# Patient Record
Sex: Male | Born: 1990 | Hispanic: Yes | Marital: Single | Smoking: Current every day smoker
Health system: Southern US, Community
[De-identification: ages and names within clinical notes are randomized; demographics above are authoritative.]

## PROBLEM LIST (undated history)

## (undated) DIAGNOSIS — F909 Attention-deficit hyperactivity disorder, unspecified type: Secondary | ICD-10-CM

## (undated) DIAGNOSIS — K219 Gastro-esophageal reflux disease without esophagitis: Secondary | ICD-10-CM

## (undated) DIAGNOSIS — F319 Bipolar disorder, unspecified: Secondary | ICD-10-CM

## (undated) DIAGNOSIS — F603 Borderline personality disorder: Secondary | ICD-10-CM

---

## 2021-01-26 ENCOUNTER — Emergency Department (HOSPITAL_COMMUNITY)
Admission: EM | Admit: 2021-01-26 | Discharge: 2021-01-27 | Disposition: A | Discharge status: Home / Self Care | Payer: Self-pay | Attending: Emergency Medicine | Admitting: Emergency Medicine

## 2021-01-26 ENCOUNTER — Encounter (HOSPITAL_COMMUNITY): Payer: Self-pay

## 2021-01-26 ENCOUNTER — Other Ambulatory Visit: Payer: Self-pay

## 2021-01-26 DIAGNOSIS — R000 Tachycardia, unspecified: Secondary | ICD-10-CM | POA: Insufficient documentation

## 2021-01-26 DIAGNOSIS — R45850 Homicidal ideations: Secondary | ICD-10-CM | POA: Insufficient documentation

## 2021-01-26 DIAGNOSIS — Z20822 Contact with and (suspected) exposure to covid-19: Secondary | ICD-10-CM | POA: Insufficient documentation

## 2021-01-26 DIAGNOSIS — F1910 Other psychoactive substance abuse, uncomplicated: Secondary | ICD-10-CM

## 2021-01-26 DIAGNOSIS — F1994 Other psychoactive substance use, unspecified with psychoactive substance-induced mood disorder: Secondary | ICD-10-CM

## 2021-01-26 DIAGNOSIS — F1914 Other psychoactive substance abuse with psychoactive substance-induced mood disorder: Secondary | ICD-10-CM | POA: Insufficient documentation

## 2021-01-26 LAB — RAPID URINE DRUG SCREEN, HOSP PERFORMED
Amphetamines: POSITIVE — AB
Barbiturates: NOT DETECTED
Benzodiazepines: NOT DETECTED
Cocaine: POSITIVE — AB
Opiates: NOT DETECTED
Tetrahydrocannabinol: POSITIVE — AB

## 2021-01-26 LAB — CBC
HCT: 44.4 % (ref 39.0–52.0)
Hemoglobin: 14.7 g/dL (ref 13.0–17.0)
MCH: 28.6 pg (ref 26.0–34.0)
MCHC: 33.1 g/dL (ref 30.0–36.0)
MCV: 86.4 fL (ref 80.0–100.0)
Platelets: 332 10*3/uL (ref 150–400)
RBC: 5.14 MIL/uL (ref 4.22–5.81)
RDW: 13.3 % (ref 11.5–15.5)
WBC: 10.8 10*3/uL — ABNORMAL HIGH (ref 4.0–10.5)
nRBC: 0 % (ref 0.0–0.2)

## 2021-01-26 LAB — RESP PANEL BY RT-PCR (FLU A&B, COVID) ARPGX2
Influenza A by PCR: NEGATIVE
Influenza B by PCR: NEGATIVE
SARS Coronavirus 2 by RT PCR: NEGATIVE

## 2021-01-26 LAB — COMPREHENSIVE METABOLIC PANEL
ALT: 89 U/L — ABNORMAL HIGH (ref 0–44)
AST: 162 U/L — ABNORMAL HIGH (ref 15–41)
Albumin: 4.7 g/dL (ref 3.5–5.0)
Alkaline Phosphatase: 77 U/L (ref 38–126)
Anion gap: 12 (ref 5–15)
BUN: 17 mg/dL (ref 6–20)
CO2: 25 mmol/L (ref 22–32)
Calcium: 9.9 mg/dL (ref 8.9–10.3)
Chloride: 100 mmol/L (ref 98–111)
Creatinine, Ser: 1.54 mg/dL — ABNORMAL HIGH (ref 0.61–1.24)
GFR, Estimated: >60 mL/min (ref >60)
Glucose, Bld: 109 mg/dL — ABNORMAL HIGH (ref 70–99)
Potassium: 3.6 mmol/L (ref 3.5–5.1)
Sodium: 137 mmol/L (ref 135–145)
Total Bilirubin: 1.8 mg/dL — ABNORMAL HIGH (ref 0.3–1.2)
Total Protein: 7.6 g/dL (ref 6.5–8.1)

## 2021-01-26 LAB — ETHANOL: Alcohol, Ethyl (B): <10 mg/dL (ref <10)

## 2021-01-26 NOTE — ED Notes (Signed)
Pt made phone call to brother at nursing station

## 2021-01-26 NOTE — ED Triage Notes (Signed)
Emergency Medicine Provider Triage Evaluation Note  Jonathan Montgomery , a 30 y.o. male  was evaluated in triage.  Pt brought in by [employer] under IVC order, per IVC patient was going to kill himself and girlfriend.  Patient recently moved here from [place], admits to heroin use, denies medical complaints.  Review of Systems  Positive: SI., HI Negative: CP, abdominal pain, injury  Physical Exam  BP (!) 137/98 (BP Location: Right Arm)   Pulse (!) 119   Temp 98.7 F (37.1 C)   Resp 18   SpO2 100%  Gen:   Awake, no distress   HEENT:  Injury to nose, denies acute injury Resp:  Normal effort  Cardiac:  Tachycardic Abd:   Nondistended, nontender  MSK:   Moves extremities without difficulty  Neuro:  Speech clear, pressured  Medical Decision Making  Medically screening exam initiated at 10:16 AM.  Appropriate orders placed.  Jonathan Montgomery was informed that the remainder of the evaluation will be completed by another provider, this initial triage assessment does not replace that evaluation, and the importance of remaining in the ED until their evaluation is complete.  Clinical Impression  Substance abuse, psychosis,   Jeannie Fend, PA-C 01/26/21 1028

## 2021-01-26 NOTE — BH Assessment (Signed)
TTS Assessment. Awaiting provider disposition.

## 2021-01-26 NOTE — ED Notes (Addendum)
Per Melynda Ripple  DISPOSITION: Gave clinical report to [facility], NP, who determined patient meets criteria for overnight observation. Pending am psych evaluation and collateral information from additional supports. Clinician was able to obtain very limited to no information about patient's safety, history, etc.. Further discussed disposition with night time provider Otila Back, NP) who recommended overnight observation and am psych evaluation.
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
## 2021-01-26 NOTE — ED Provider Notes (Signed)
[facility] EMERGENCY DEPARTMENT Provider Note   CSN: [id number] Arrival date & time: 01/26/21  8466     History Chief Complaint  Patient presents with  . IVC    Jonathan Montgomery is a 30 y.o. male.  HPI   Patient is a 30 year old male who presents the emergency department in [employer] custody under IVC.  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  He had been clean for over a year and relapsed over the last few days with his girlfriend.  His girlfriend is also using drugs.  He states that that an argument and according to the IVC paperwork his girlfriend told the police that he stated that he was going to kill himself as well as her.  Patient denies any physical symptoms at this time.  He is a small amount of dried blood noted to the bridge of his nose which he states is from "showering too much a few days ago".  Patient denies any physical complaints.  Police state that he was nauseated and retching when they arrived but this quickly resolved and he has had no further occurrences.  Denies any nausea or vomiting to me. Denies any recent or regular ETOH use.      History reviewed. No pertinent past medical history.  There are no problems to display for this patient.   History reviewed. No pertinent surgical history.     History reviewed. No pertinent family history.  Social History   Tobacco Use  . Smoking status: Never Smoker  . Smokeless tobacco: Never Used    Home Medications Prior to Admission medications   Not on File    Allergies    Patient has no allergy information on record.  Review of Systems   Review of Systems  All other systems reviewed and are negative. Ten systems reviewed and are negative for acute change, except as noted in the HPI.    Physical Exam Updated Vital Signs BP (!) 153/84 (BP Location: Left Arm)   Pulse (!) 105   Temp 98.7 F (37.1 C)   Resp 18   SpO2  98%   Physical Exam Vitals and nursing note reviewed.  Constitutional:      General: He is not in acute distress.    Appearance: Normal appearance. He is not ill-appearing, toxic-appearing or diaphoretic.  HENT:     Head: Normocephalic and atraumatic.     Right Ear: External ear normal.     Left Ear: External ear normal.     Nose: Nose normal.     Mouth/Throat:     Mouth: Mucous membranes are moist.     Pharynx: Oropharynx is clear. No oropharyngeal exudate or posterior oropharyngeal erythema.  Eyes:     Extraocular Movements: Extraocular movements intact.  Cardiovascular:     Rate and Rhythm: Regular rhythm. Tachycardia present.     Pulses: Normal pulses.     Heart sounds: Normal heart sounds. No murmur heard. No friction rub. No gallop.   Pulmonary:     Effort: Pulmonary effort is normal. No respiratory distress.     Breath sounds: Normal breath sounds. No stridor. No wheezing, rhonchi or rales.  Abdominal:     General: Abdomen is flat.     Tenderness: There is no abdominal tenderness.  Musculoskeletal:        General: Normal range of motion.     Cervical back: Normal range of motion and neck  supple. No tenderness.  Skin:    General: Skin is warm and dry.  Neurological:     General: No focal deficit present.     Mental Status: He is alert and oriented to person, place, and time.  Psychiatric:        Attention and Perception: Attention normal. He is attentive.        Mood and Affect: Mood normal.        Speech: Speech is rapid and pressured.        Behavior: Behavior is hyperactive. Behavior is cooperative.        Thought Content: Thought content includes homicidal ideation.    ED Results / Procedures / Treatments   Labs (all labs ordered are listed, but only abnormal results are displayed) Labs Reviewed  COMPREHENSIVE METABOLIC PANEL - Abnormal; Notable for the following components:      Result Value   Glucose, Bld 109 (*)    Creatinine, Ser 1.54 (*)    AST 162  (*)    ALT 89 (*)    Total Bilirubin 1.8 (*)    All other components within normal limits  CBC - Abnormal; Notable for the following components:   WBC 10.8 (*)    All other components within normal limits  RESP PANEL BY RT-PCR (FLU A&B, COVID) [id number]  ETHANOL  RAPID URINE DRUG SCREEN, HOSP PERFORMED   EKG None  Radiology No results found.  Procedures Procedures   Medications Ordered in ED Medications - No data to display  ED Course  I have reviewed the triage vital signs and the nursing notes.  Pertinent labs & imaging results that were available during my care of the patient were reviewed by me and considered in my medical decision making (see chart for details).    MDM Rules/Calculators/A&P                          Patient is a 30 year old male with history of polysubstance abuse who presents the emergency department under IVC via [employer].  Per IVC paperwork, patient was threatening to kill himself as well as his girlfriend.  On my exam he speaks erratically and appears tangential.  He has difficulty staying on topic.  He has been using IV heroin as well as IV methamphetamines.  He last used last night.  Denies any recent or regular alcohol use.  Basic labs obtained.  Patient appears medically cleared at this time.  TTS consult has been placed.  Disposition pending TTS recommendations.  Final Clinical Impression(s) / ED Diagnoses Final diagnoses:  Homicidal behavior  Polysubstance abuse [facility])    Rx / DC Orders ED Discharge Orders    None       Placido Sou, PA-C 01/26/21 1350    Cathren Laine, MD 01/26/21 1525

## 2021-01-26 NOTE — ED Triage Notes (Signed)
Pt BIB [employer] due to IVC. Per IVC paperwork pt reports he was going to kill himself and girlfriend. Pt recently moved here from [place]. Pt Is on heroin.

## 2021-01-26 NOTE — BH Assessment (Signed)
DISPOSITION: Gave clinical report to [facility] Rankin, NP,  who determined patient meets criteria for overnight observation. Pending am psych evaluation and collateral information from additional supports. Clinician was able to obtain very limited to no information about patient's safety, history, etc from patient's brother [name]) [phone] . Further discussed disposition with night time provider Otila Back, NP) who recommended overnight observation and am psych evaluation. [name], RN), provided updates regarding patient's plan of care

## 2021-01-26 NOTE — BH Assessment (Addendum)
TTS Clinician requested [place], NP to place TTS machine in patient's room.

## 2021-01-26 NOTE — ED Notes (Signed)
Pt belongings inventoried and valuables given to security. Pt wanded by security.

## 2021-01-27 DIAGNOSIS — F1994 Other psychoactive substance use, unspecified with psychoactive substance-induced mood disorder: Secondary | ICD-10-CM

## 2021-01-27 MED ORDER — LORAZEPAM 2 MG/ML IJ SOLN
2.0000 mg | Freq: Once | INTRAMUSCULAR | Status: AC
  Administered 2021-01-27: 2 mg via INTRAMUSCULAR
  Filled 2021-01-27: qty 1

## 2021-01-27 MED ORDER — HALOPERIDOL LACTATE 5 MG/ML IJ SOLN
5.0000 mg | Freq: Once | INTRAMUSCULAR | Status: AC
  Administered 2021-01-27: 5 mg via INTRAMUSCULAR
  Filled 2021-01-27: qty 1

## 2021-01-27 NOTE — ED Notes (Signed)
Pt discharged home per MD order. Discharge summary reviewed with pt, pt verbalizes understanding. Reports discharge ride home. Personal property returned to pt. Pt denies SI/HI/AVH. Ambulatory off unit. No s/s of acute distress noted.

## 2021-01-27 NOTE — ED Notes (Addendum)
This RN went to get pt ready for transfer. Pt asked "am I waiting for transport? To where?" This RN notified pt that he was going to [facility]. At this point pt began yelling "why am I going to [facility]? I have cooperated. I have listened." Etc. This RN let pt know that he was seen by the counselor and that is what they saw best. Pt states "I want to speak to someone. I am not going!" At this time pt jumped out of bed and ran down hallway to try and leave the department. Pt was stopped and detained by [facility] and Tax adviser. Dr. Julieanne Manson notified and med orders are to be put in. Pt unable to go to [facility] now since we had to medicate him. Security at bedside now. This RN called [employer] to cancel transport.

## 2021-01-27 NOTE — ED Notes (Signed)
[employer] non emergency line called to transfer this pt to [facility]

## 2021-01-27 NOTE — Discharge Instructions (Addendum)
You have been cleared by behavioral health for discharge home.  Follow-up as per behavioral health. 

## 2021-01-27 NOTE — ED Provider Notes (Signed)
I was informed by nursing the patient is been accepted to behavioral urgent care for evaluation this morning.  I informed the patient of this and he initially agreed.  EMTALA documentation was filled out for patient to be transferred.  He is under IVC currently.  As I was seeing other patient, I was told that patient tried to escape and was very agitated and had to be taken back to his exam bed.  As the patient is now agitated and is requiring medication to calm him down, I suspect he is not appropriate for behavioral urgent care at this time.  We will have nursing communicate this with the TTS team and have them reassess patient to determine best place for patient to be further evaluated today.   Gayle Collard, Canary Brim, MD 01/27/21 [phone]

## 2021-01-27 NOTE — ED Notes (Signed)
EDP Zackowski to resend IVC ; Diplomatic Services operational officer to fax.

## 2021-01-27 NOTE — Consult Note (Signed)
Telepsych Consultation   Reason for Consult:  Psychiatry provider assessment Referring Physician:  [facility] Location of [facility]: [facility] Location of Provider: Behavioral Health TTS Department  Patient Identification: Jonathan Montgomery MRN:  [id number] Principal Diagnosis: Substance induced mood disorder (HCC) Diagnosis:  Principal Problem:   Substance induced mood disorder (HCC)   Total Time spent with patient: 30 minutes  Subjective:   Jonathan Montgomery is a 30 y.o. male patient admitted with involuntary commitment petition.  Patient states "me and my girlfriend are on drugs, I came here to get off of them."  Patient reports daily use of marijuana, methamphetamine and heroin.  He reports this has gone on for approximately 6 months. Jonathan Montgomery states "my girlfriend went out to buy more dope and then came home, 2 hours later the cops were at the door and brought him here."  HPI:   Patient reassessed by nurse practitioner.  He is alert and oriented, appears anxious when discussing discharge.  He is cooperative and answers appropriately during assessment.  Jonathan Montgomery denies suicidal and homicidal ideations.  He also denies any history of suicide attempts.  Jonathan Montgomery reports history of cutting, last cutting episode "years ago."  Midwife verbally for safety with this Clinical research associate.  He denies auditory and visual hallucinations, does make reference to hallucinations while using methamphetamine.  Patient denies any command hallucinations.  He denies symptoms of paranoia.  Jonathan Montgomery denies any mental health history.  He denies any current medications.  He reports he has been treated for substance use in the past and that AA worked well for him.  He denies currently having a sponsor but is willing to follow-up with AA on an outpatient basis.  Jonathan Montgomery resides in [place] with his girlfriend.  He reports he relocated to [place] from [place] 2 months ago related to this girlfriend.   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  He endorses average sleep and appetite.  He denies alcohol use.  Jonathan Montgomery noted to have abrasion to nose.  Reports this abrasion is related to "showering too long."  He denies pain.  He denies being involved in physical altercation.  Jonathan Montgomery reports he would like to discharge to girlfriend's home then work out arrangements to purchase a flight to his brother's home in [place].  Jonathan Montgomery refuses consent to speak with his girlfriend, states "she is on meth and heroin as well, she is using as much as I am, I do not think you should talk to another drug addict."  Jonathan Montgomery does give consent to speak with his brother, Jonathan Montgomery phone number [phone].  Attempted to call brother x2, voicemail not set up.  TTS counselor spoke with brother on yesterday who denies concern for patient safety.  Past Psychiatric History: none reported  Risk to Self:   Denies Risk to Others:   Denies Prior Inpatient Therapy:   None reported Prior Outpatient Therapy:   None reported  Past Medical History: History reviewed. No pertinent past medical history. History reviewed. No pertinent surgical history. Family History: History reviewed. No pertinent family history. Family Psychiatric  History: None reported Social History:  Social History   Substance and Sexual Activity  Alcohol Use None     Social History   Substance and Sexual Activity  Drug Use Not on file    Social History   Socioeconomic History  . Marital status: Single    Spouse name: Not on file  . Number of children: Not on  file  . Years of education: Not on file  . Highest education level: Not on file  Occupational History  . Not on file  Tobacco Use  . Smoking status: Never Smoker  . Smokeless tobacco: Never Used  Substance and Sexual Activity  . Alcohol use: Not on file  . Drug use: Not on file  . Sexual activity: Not on file  Other Topics Concern  . Not on file  Social  History Narrative  . Not on file   Social Determinants of Health   Financial Resource Strain: Not on file  Food Insecurity: Not on file  Transportation Needs: Not on file  Physical Activity: Not on file  Stress: Not on file  Social Connections: Not on file   Additional Social History:    Allergies:  Not on File  Labs:  Results for orders placed or performed during the hospital encounter of 01/26/21 (from the past 48 hour(s))  Comprehensive metabolic panel     Status: Abnormal   Collection Time: 01/26/21 10:07 AM  Result Value Ref Range   Sodium 137 135 - 145 mmol/L   Potassium 3.6 3.5 - 5.1 mmol/L   Chloride 100 98 - 111 mmol/L   CO2 25 22 - 32 mmol/L   Glucose, Bld 109 (H) 70 - 99 mg/dL    Comment: Glucose reference range applies only to samples taken after fasting for at least 8 hours.   BUN 17 6 - 20 mg/dL   Creatinine, Ser 0.25 (H) 0.61 - 1.24 mg/dL   Calcium 9.9 8.9 - 42.7 mg/dL   Total Protein 7.6 6.5 - 8.1 g/dL   Albumin 4.7 3.5 - 5.0 g/dL   AST 062 (H) 15 - 41 U/L   ALT 89 (H) 0 - 44 U/L   Alkaline Phosphatase 77 38 - 126 U/L   Total Bilirubin 1.8 (H) 0.3 - 1.2 mg/dL   GFR, Estimated >37 >62 mL/min    Comment: (NOTE) Calculated using the CKD-EPI Creatinine Equation (2021)    Anion gap 12 5 - 15    Comment: Performed at [facility], [address] [place], [place] [address]  Ethanol     Status: None   Collection Time: 01/26/21 10:07 AM  Result Value Ref Range   Alcohol, Ethyl (B) <10 <10 mg/dL    Comment: (NOTE) Lowest detectable limit for serum alcohol is 10 mg/dL.  For medical purposes only. Performed at [facility], [address] [place], [place] [address]   cbc     Status: Abnormal   Collection Time: 01/26/21 10:07 AM  Result Value Ref Range   WBC 10.8 (H) 4.0 - 10.5 K/uL   RBC 5.14 4.22 - 5.81 MIL/uL   Hemoglobin 14.7 13.0 - 17.0 g/dL   HCT 73.7 10.6 - 26.9 %   MCV 86.4 80.0 - 100.0 fL   MCH 28.6 26.0 - 34.0 pg   MCHC 33.1  30.0 - 36.0 g/dL   RDW 48.5 46.2 - 70.3 %   Platelets 332 150 - 400 K/uL   nRBC 0.0 0.0 - 0.2 %    Comment: Performed at [facility], [address], [place], [place] [address]  Resp Panel by RT-PCR (Flu A&B, Covid) Nasopharyngeal Swab     Status: None   Collection Time: 01/26/21 12:38 PM   Specimen: Nasopharyngeal Swab; Nasopharyngeal(NP) swabs in vial transport medium  Result Value Ref Range   SARS Coronavirus 2 by RT PCR NEGATIVE NEGATIVE  Comment: (NOTE) SARS-CoV-2 target nucleic acids are NOT DETECTED.  The SARS-CoV-2 RNA is generally detectable in upper respiratory specimens during the acute phase of infection. The lowest concentration of SARS-CoV-2 viral copies this assay can detect is 138 copies/mL. A negative result does not preclude SARS-Cov-2 infection and should not be used as the sole basis for treatment or other patient management decisions. A negative result may occur with  improper specimen collection/handling, submission of specimen other than nasopharyngeal swab, presence of viral mutation(s) within the areas targeted by this assay, and inadequate number of viral copies(<138 copies/mL). A negative result must be combined with clinical observations, patient history, and epidemiological information. The expected result is Negative.  Fact Sheet for Patients:  BloggerCourse.com  Fact Sheet for Healthcare Providers:  SeriousBroker.it  This test is no t yet approved or cleared by the [place] FDA and  has been authorized for detection and/or diagnosis of SARS-CoV-2 by FDA under an Emergency Use Authorization (EUA). This EUA will remain  in effect (meaning this test can be used) for the duration of the COVID-19 declaration under Section 564(b)(1) of the Act, 21 U.S.C.section 360bbb-3(b)(1), unless the authorization is terminated  or revoked sooner.       Influenza A by PCR NEGATIVE NEGATIVE    Influenza B by PCR NEGATIVE NEGATIVE    Comment: (NOTE) The Xpert Xpress SARS-CoV-2/FLU/RSV plus assay is intended as an aid in the diagnosis of influenza from Nasopharyngeal swab specimens and should not be used as a sole basis for treatment. Nasal washings and aspirates are unacceptable for Xpert Xpress SARS-CoV-2/FLU/RSV testing.  Fact Sheet for Patients: BloggerCourse.com  Fact Sheet for Healthcare Providers: SeriousBroker.it  This test is not yet approved or cleared by the [place] FDA and has been authorized for detection and/or diagnosis of SARS-CoV-2 by FDA under an Emergency Use Authorization (EUA). This EUA will remain in effect (meaning this test can be used) for the duration of the COVID-19 declaration under Section 564(b)(1) of the Act, 21 U.S.C. section 360bbb-3(b)(1), unless the authorization is terminated or revoked.  Performed at [facility], [address] [place], [place] [address]   Rapid urine drug screen (hospital performed)     Status: Abnormal   Collection Time: 01/26/21  5:55 PM  Result Value Ref Range   Opiates NONE DETECTED NONE DETECTED   Cocaine POSITIVE (A) NONE DETECTED   Benzodiazepines NONE DETECTED NONE DETECTED   Amphetamines POSITIVE (A) NONE DETECTED   Tetrahydrocannabinol POSITIVE (A) NONE DETECTED   Barbiturates NONE DETECTED NONE DETECTED    Comment: (NOTE) DRUG SCREEN FOR MEDICAL PURPOSES ONLY.  IF CONFIRMATION IS NEEDED FOR ANY PURPOSE, NOTIFY LAB WITHIN 5 DAYS.  LOWEST DETECTABLE LIMITS FOR URINE DRUG SCREEN Drug Class                     Cutoff (ng/mL) Amphetamine and metabolites    1000 Barbiturate and metabolites    200 Benzodiazepine                 200 Tricyclics and metabolites     300 Opiates and metabolites        300 Cocaine and metabolites        300 THC                            50 Performed at [facility], [address], [place],  [place] [address]  Medications:  No current facility-administered medications for this encounter.   No current outpatient medications on file.    Musculoskeletal: Strength & Muscle Tone: within normal limits Gait & Station: normal Patient leans: N/A  Psychiatric Specialty Exam: Physical Exam Vitals and nursing note reviewed.  Constitutional:      Appearance: He is well-developed.  HENT:     Head: Normocephalic.  Cardiovascular:     Rate and Rhythm: Normal rate.  Pulmonary:     Effort: Pulmonary effort is normal.  Musculoskeletal:        General: Normal range of motion.     Cervical back: Normal range of motion.  Skin:    Findings: Abrasion present.       Neurological:     Mental Status: He is alert and oriented to person, place, and time.  Psychiatric:        Attention and Perception: Attention and perception normal.        Mood and Affect: Mood is anxious. Affect is labile.        Speech: Speech normal.        Behavior: Behavior is cooperative.        Thought Content: Thought content normal.        Cognition and Memory: Cognition and memory normal.        Judgment: Judgment normal.     Review of Systems  Constitutional: Negative.   HENT: Negative.   Eyes: Negative.   Respiratory: Negative.   Cardiovascular: Negative.   Gastrointestinal: Negative.   Genitourinary: Negative.   Musculoskeletal: Negative.   Skin: Negative.   Neurological: Negative.   Psychiatric/Behavioral: The patient is nervous/anxious.     Blood pressure (!) 138/99, pulse 92, temperature 98.2 F (36.8 C), temperature source Oral, resp. rate 20, SpO2 99 %.There is no height or weight on file to calculate BMI.  General Appearance: Casual  Eye Contact:  Good  Speech:  Clear and Coherent and Normal Rate  Volume:  Normal  Mood:  Anxious  Affect:  Appropriate and Congruent  Thought Process:  Coherent, Goal Directed and Descriptions of Associations: Intact  Orientation:  Full (Time, Place, and  Person)  Thought Content:  Logical  Suicidal Thoughts:  No  Homicidal Thoughts:  No  Memory:  Immediate;   Good Recent;   Good Remote;   Good  Judgement:  Fair  Insight:  Fair  Psychomotor Activity:  Normal  Concentration:  Concentration: Good and Attention Span: Good  Recall:  Good  Fund of Knowledge:  Good  Language:  Good  Akathisia:  No  Handed:  Right  AIMS (if indicated):     Assets:  Communication Skills Desire for Improvement Financial Resources/Insurance Housing Intimacy Leisure Time Physical Health Resilience Social Support  ADL's:  Intact  Cognition:  WNL  Sleep:        Treatment Plan Summary: Plan Patient reviewed with Dr. Bronwen Betters. TOC ordered to assist with substance use treatment resources. Peer  Support consult initiated. Recommend follow-up with outpatient psychiatry, resources provided.  Disposition: No evidence of imminent risk to self or others at present.   Patient does not meet criteria for psychiatric inpatient admission. Supportive therapy provided about ongoing stressors. Discussed crisis plan, support from social network, calling 911, coming to the Emergency Department, and calling Suicide Hotline.  This service was provided via telemedicine using a 2-way, interactive audio and video technology.  Names of all persons participating in this telemedicine service and their role in this encounter. Name: Kelden Lavallee  Role: Patient  Name: Doran Heaterina Michaeljoseph Revolorio Role: FNP  Name: Dr. Bronwen BettersLaubach Role: Psychiatry    Jonathan Lanceina L Tyshon Fanning, FNP 01/27/2021 8:25 AM

## 2021-01-27 NOTE — ED Notes (Signed)
Per Marzetta Board RN pt can be transferred to [facility] - accepted by Dr. Lucianne Muss. EMTALA to be completed

## 2021-01-27 NOTE — ED Notes (Signed)
Security at bedside. Pt increasing upset. Pt refusing meds saying "I have a right to refuse. It is against my religion." This RN, Sophie RN, Education officer, community, and security notified pt that he is under IVC. Pt eventually let Sophie RN administer meds. Security remains at bedside.

## 2021-01-27 NOTE — ED Notes (Signed)
This RN spoke to Lane at [facility] to see if pt will still be accepted since he got medications. Feliz Beam told this RN that he would have to call back. Feliz Beam given number to call back at.

## 2021-01-27 NOTE — ED Notes (Signed)
Tele psych machine to bedside  

## 2021-01-27 NOTE — ED Notes (Signed)
Pt up at nurses station using phone for discharge ride home

## 2021-01-27 NOTE — Progress Notes (Signed)
CSW spoke with patient about providing him with outpatient mental health resource list. Patient told CSW can we go over it later when he is more awake. CSW stated she would leave the resource on his table for him to review. Patient rolled back over.

## 2021-02-05 ENCOUNTER — Emergency Department (HOSPITAL_COMMUNITY)
Admission: EM | Admit: 2021-02-05 | Discharge: 2021-02-06 | Disposition: A | Discharge status: Home / Self Care | Payer: Self-pay | Attending: Emergency Medicine | Admitting: Emergency Medicine

## 2021-02-05 ENCOUNTER — Emergency Department (HOSPITAL_COMMUNITY): Payer: Self-pay

## 2021-02-05 ENCOUNTER — Emergency Department (HOSPITAL_COMMUNITY): Payer: Self-pay | Admitting: Certified Registered Nurse Anesthetist

## 2021-02-05 ENCOUNTER — Encounter (HOSPITAL_COMMUNITY)
Admission: EM | Disposition: A | Discharge status: Home / Self Care | Payer: Self-pay | Source: Home / Self Care | Attending: Emergency Medicine

## 2021-02-05 DIAGNOSIS — S01111A Laceration without foreign body of right eyelid and periocular area, initial encounter: Secondary | ICD-10-CM | POA: Insufficient documentation

## 2021-02-05 DIAGNOSIS — Y9339 Activity, other involving climbing, rappelling and jumping off: Secondary | ICD-10-CM | POA: Insufficient documentation

## 2021-02-05 DIAGNOSIS — F1994 Other psychoactive substance use, unspecified with psychoactive substance-induced mood disorder: Secondary | ICD-10-CM

## 2021-02-05 DIAGNOSIS — T1490XA Injury, unspecified, initial encounter: Secondary | ICD-10-CM

## 2021-02-05 DIAGNOSIS — R52 Pain, unspecified: Secondary | ICD-10-CM

## 2021-02-05 DIAGNOSIS — Z23 Encounter for immunization: Secondary | ICD-10-CM | POA: Insufficient documentation

## 2021-02-05 DIAGNOSIS — M25572 Pain in left ankle and joints of left foot: Secondary | ICD-10-CM | POA: Insufficient documentation

## 2021-02-05 DIAGNOSIS — W1789XA Other fall from one level to another, initial encounter: Secondary | ICD-10-CM | POA: Insufficient documentation

## 2021-02-05 DIAGNOSIS — S52501A Unspecified fracture of the lower end of right radius, initial encounter for closed fracture: Secondary | ICD-10-CM | POA: Insufficient documentation

## 2021-02-05 DIAGNOSIS — Z20822 Contact with and (suspected) exposure to covid-19: Secondary | ICD-10-CM | POA: Insufficient documentation

## 2021-02-05 HISTORY — PX: OPEN REDUCTION INTERNAL FIXATION (ORIF) DISTAL RADIAL FRACTURE: SHX5989

## 2021-02-05 LAB — RESP PANEL BY RT-PCR (FLU A&B, COVID) ARPGX2
Influenza A by PCR: NEGATIVE
Influenza B by PCR: NEGATIVE
SARS Coronavirus 2 by RT PCR: NEGATIVE

## 2021-02-05 LAB — CBC
HCT: 39.9 % (ref 39.0–52.0)
Hemoglobin: 13.3 g/dL (ref 13.0–17.0)
MCH: 27.9 pg (ref 26.0–34.0)
MCHC: 33.3 g/dL (ref 30.0–36.0)
MCV: 83.6 fL (ref 80.0–100.0)
Platelets: 281 10*3/uL (ref 150–400)
RBC: 4.77 MIL/uL (ref 4.22–5.81)
RDW: 12.9 % (ref 11.5–15.5)
WBC: 7.3 10*3/uL (ref 4.0–10.5)
nRBC: 0 % (ref 0.0–0.2)

## 2021-02-05 LAB — COMPREHENSIVE METABOLIC PANEL
ALT: 100 U/L — ABNORMAL HIGH (ref 0–44)
AST: 140 U/L — ABNORMAL HIGH (ref 15–41)
Albumin: 3.6 g/dL (ref 3.5–5.0)
Alkaline Phosphatase: 57 U/L (ref 38–126)
Anion gap: 12 (ref 5–15)
BUN: 10 mg/dL (ref 6–20)
CO2: 25 mmol/L (ref 22–32)
Calcium: 8.8 mg/dL — ABNORMAL LOW (ref 8.9–10.3)
Chloride: 97 mmol/L — ABNORMAL LOW (ref 98–111)
Creatinine, Ser: 1.24 mg/dL (ref 0.61–1.24)
GFR, Estimated: >60 mL/min (ref >60)
Glucose, Bld: 103 mg/dL — ABNORMAL HIGH (ref 70–99)
Potassium: 2.7 mmol/L — CL (ref 3.5–5.1)
Sodium: 134 mmol/L — ABNORMAL LOW (ref 135–145)
Total Bilirubin: 1.4 mg/dL — ABNORMAL HIGH (ref 0.3–1.2)
Total Protein: 6.3 g/dL — ABNORMAL LOW (ref 6.5–8.1)

## 2021-02-05 LAB — I-STAT CHEM 8, ED
BUN: 12 mg/dL (ref 6–20)
Calcium, Ion: 1.09 mmol/L — ABNORMAL LOW (ref 1.15–1.40)
Chloride: 95 mmol/L — ABNORMAL LOW (ref 98–111)
Creatinine, Ser: 1.1 mg/dL (ref 0.61–1.24)
Glucose, Bld: 103 mg/dL — ABNORMAL HIGH (ref 70–99)
HCT: 39 % (ref 39.0–52.0)
Hemoglobin: 13.3 g/dL (ref 13.0–17.0)
Potassium: 3 mmol/L — ABNORMAL LOW (ref 3.5–5.1)
Sodium: 134 mmol/L — ABNORMAL LOW (ref 135–145)
TCO2: 27 mmol/L (ref 22–32)

## 2021-02-05 LAB — LACTIC ACID, PLASMA: Lactic Acid, Venous: 3 mmol/L (ref 0.5–1.9)

## 2021-02-05 LAB — ETHANOL: Alcohol, Ethyl (B): <10 mg/dL (ref <10)

## 2021-02-05 LAB — PROTIME-INR
INR: 1 (ref 0.8–1.2)
Prothrombin Time: 13.5 seconds (ref 11.4–15.2)

## 2021-02-05 SURGERY — OPEN REDUCTION INTERNAL FIXATION (ORIF) DISTAL RADIUS FRACTURE
Anesthesia: Monitor Anesthesia Care | Laterality: Right

## 2021-02-05 MED ORDER — CEFAZOLIN SODIUM-DEXTROSE 2-4 GM/100ML-% IV SOLN
2.0000 g | Freq: Once | INTRAVENOUS | Status: AC
  Administered 2021-02-05: 2 g via INTRAVENOUS
  Filled 2021-02-05: qty 100

## 2021-02-05 MED ORDER — MIDAZOLAM HCL 2 MG/2ML IJ SOLN
INTRAMUSCULAR | Status: DC | PRN
  Administered 2021-02-05: 2 mg via INTRAVENOUS

## 2021-02-05 MED ORDER — LIDOCAINE HCL (PF) 1 % IJ SOLN
30.0000 mL | Freq: Once | INTRAMUSCULAR | Status: AC
  Administered 2021-02-05: 30 mL via INTRADERMAL
  Filled 2021-02-05: qty 30

## 2021-02-05 MED ORDER — IOHEXOL 300 MG/ML  SOLN
100.0000 mL | Freq: Once | INTRAMUSCULAR | Status: AC | PRN
  Administered 2021-02-05: 100 mL via INTRAVENOUS

## 2021-02-05 MED ORDER — LIDOCAINE 2% (20 MG/ML) 5 ML SYRINGE
INTRAMUSCULAR | Status: DC | PRN
  Administered 2021-02-05: 60 mg via INTRAVENOUS

## 2021-02-05 MED ORDER — LORAZEPAM 2 MG/ML IJ SOLN
1.0000 mg | Freq: Once | INTRAMUSCULAR | Status: AC
  Administered 2021-02-05: 1 mg via INTRAVENOUS
  Filled 2021-02-05: qty 1

## 2021-02-05 MED ORDER — PROPOFOL 10 MG/ML IV BOLUS
INTRAVENOUS | Status: AC
  Filled 2021-02-05: qty 20

## 2021-02-05 MED ORDER — DIPHENHYDRAMINE HCL 50 MG/ML IJ SOLN
INTRAMUSCULAR | Status: DC | PRN
  Administered 2021-02-05: 25 mg via INTRAVENOUS

## 2021-02-05 MED ORDER — FENTANYL CITRATE (PF) 100 MCG/2ML IJ SOLN
50.0000 ug | Freq: Once | INTRAMUSCULAR | Status: AC
  Administered 2021-02-05: 50 ug via INTRAVENOUS
  Filled 2021-02-05: qty 2

## 2021-02-05 MED ORDER — TETANUS-DIPHTH-ACELL PERTUSSIS 5-2.5-18.5 LF-MCG/0.5 IM SUSY
0.5000 mL | PREFILLED_SYRINGE | Freq: Once | INTRAMUSCULAR | Status: AC
  Administered 2021-02-05: 0.5 mL via INTRAMUSCULAR
  Filled 2021-02-05: qty 0.5

## 2021-02-05 MED ORDER — ACETAMINOPHEN 500 MG PO TABS
1000.0000 mg | ORAL_TABLET | Freq: Once | ORAL | Status: AC
  Administered 2021-02-05: 1000 mg via ORAL
  Filled 2021-02-05: qty 2

## 2021-02-05 MED ORDER — LACTATED RINGERS IV BOLUS
1000.0000 mL | Freq: Once | INTRAVENOUS | Status: AC
  Administered 2021-02-05: 1000 mL via INTRAVENOUS

## 2021-02-05 MED ORDER — 0.9 % SODIUM CHLORIDE (POUR BTL) OPTIME
TOPICAL | Status: DC | PRN
  Administered 2021-02-05: 2000 mL

## 2021-02-05 MED ORDER — DEXAMETHASONE SODIUM PHOSPHATE 10 MG/ML IJ SOLN
INTRAMUSCULAR | Status: DC | PRN
  Administered 2021-02-05: 10 mg via INTRAVENOUS

## 2021-02-05 MED ORDER — PROPOFOL 10 MG/ML IV BOLUS
INTRAVENOUS | Status: DC | PRN
  Administered 2021-02-05: 200 mg via INTRAVENOUS
  Administered 2021-02-05: 100 mg via INTRAVENOUS

## 2021-02-05 MED ORDER — LACTATED RINGERS IV SOLN: INTRAVENOUS | Status: DC

## 2021-02-05 MED ORDER — FENTANYL CITRATE (PF) 250 MCG/5ML IJ SOLN
INTRAMUSCULAR | Status: AC
  Filled 2021-02-05: qty 5

## 2021-02-05 MED ORDER — LIDOCAINE 2% (20 MG/ML) 5 ML SYRINGE
INTRAMUSCULAR | Status: AC
  Filled 2021-02-05: qty 5

## 2021-02-05 MED ORDER — BUPIVACAINE-EPINEPHRINE (PF) 0.5% -1:200000 IJ SOLN
INTRAMUSCULAR | Status: DC | PRN
  Administered 2021-02-05: 30 mL via PERINEURAL

## 2021-02-05 MED ORDER — POTASSIUM CHLORIDE 10 MEQ/100ML IV SOLN
10.0000 meq | Freq: Once | INTRAVENOUS | Status: AC
  Administered 2021-02-05: 10 meq via INTRAVENOUS
  Filled 2021-02-05: qty 100

## 2021-02-05 MED ORDER — DEXMEDETOMIDINE (PRECEDEX) IN NS 20 MCG/5ML (4 MCG/ML) IV SYRINGE
PREFILLED_SYRINGE | INTRAVENOUS | Status: DC | PRN
  Administered 2021-02-05: 40 ug via INTRAVENOUS

## 2021-02-05 MED ORDER — SULFAMETHOXAZOLE-TRIMETHOPRIM 800-160 MG PO TABS: 1.0000 | ORAL_TABLET | Freq: Two times a day (BID) | ORAL | 0 refills | Status: DC

## 2021-02-05 MED ORDER — POTASSIUM CHLORIDE CRYS ER 20 MEQ PO TBCR
60.0000 meq | EXTENDED_RELEASE_TABLET | Freq: Once | ORAL | Status: AC
  Administered 2021-02-06: 60 meq via ORAL
  Filled 2021-02-05: qty 3

## 2021-02-05 MED ORDER — MIDAZOLAM HCL 2 MG/2ML IJ SOLN
INTRAMUSCULAR | Status: AC
  Filled 2021-02-05: qty 2

## 2021-02-05 MED ORDER — ONDANSETRON HCL 4 MG/2ML IJ SOLN
INTRAMUSCULAR | Status: DC | PRN
  Administered 2021-02-05: 4 mg via INTRAVENOUS

## 2021-02-05 MED ORDER — HYDROCODONE-ACETAMINOPHEN 5-325 MG PO TABS: ORAL_TABLET | ORAL | 0 refills | Status: DC

## 2021-02-05 MED ORDER — DEXMEDETOMIDINE (PRECEDEX) IN NS 20 MCG/5ML (4 MCG/ML) IV SYRINGE
PREFILLED_SYRINGE | INTRAVENOUS | Status: AC
  Filled 2021-02-05: qty 10

## 2021-02-05 MED ORDER — FENTANYL CITRATE (PF) 250 MCG/5ML IJ SOLN
INTRAMUSCULAR | Status: DC | PRN
  Administered 2021-02-05 (×5): 50 ug via INTRAVENOUS

## 2021-02-05 MED ORDER — CEFAZOLIN SODIUM-DEXTROSE 1-4 GM/50ML-% IV SOLN
INTRAVENOUS | Status: DC | PRN
  Administered 2021-02-05: 1 g via INTRAVENOUS

## 2021-02-05 MED ORDER — HYDROMORPHONE HCL 1 MG/ML IJ SOLN: 0.2500 mg | INTRAMUSCULAR | Status: DC | PRN

## 2021-02-05 MED ORDER — CEFAZOLIN SODIUM-DEXTROSE 1-4 GM/50ML-% IV SOLN
1.0000 g | Freq: Three times a day (TID) | INTRAVENOUS | Status: DC
  Administered 2021-02-06 (×2): 1 g via INTRAVENOUS
  Filled 2021-02-05 (×4): qty 50

## 2021-02-05 MED ORDER — CHLORHEXIDINE GLUCONATE 0.12 % MT SOLN
15.0000 mL | Freq: Once | OROMUCOSAL | Status: AC
  Administered 2021-02-05: 15 mL via OROMUCOSAL
  Filled 2021-02-05 (×2): qty 15

## 2021-02-05 MED ORDER — BUPIVACAINE HCL (PF) 0.25 % IJ SOLN
INTRAMUSCULAR | Status: AC
  Filled 2021-02-05: qty 30

## 2021-02-05 MED ORDER — BUPIVACAINE HCL (PF) 0.25 % IJ SOLN: INTRAMUSCULAR | Status: DC | PRN

## 2021-02-05 SURGICAL SUPPLY — 72 items
BIT DRILL 2.0 LNG QUCK RELEASE (BIT) ×1 IMPLANT
BIT DRILL QC 2.8X5 (BIT) ×2 IMPLANT
BLADE CLIPPER SURG (BLADE) IMPLANT
BNDG ELASTIC 3X5.8 VLCR STR LF (GAUZE/BANDAGES/DRESSINGS) IMPLANT
BNDG ELASTIC 4X5.8 VLCR STR LF (GAUZE/BANDAGES/DRESSINGS) ×2 IMPLANT
BNDG ESMARK 4X9 LF (GAUZE/BANDAGES/DRESSINGS) ×2 IMPLANT
BNDG GAUZE ELAST 4 BULKY (GAUZE/BANDAGES/DRESSINGS) ×2 IMPLANT
CORD BIPOLAR FORCEPS 12FT (ELECTRODE) ×2 IMPLANT
COVER SURGICAL LIGHT HANDLE (MISCELLANEOUS) ×2 IMPLANT
COVER WAND RF STERILE (DRAPES) IMPLANT
CUFF TOURN SGL QUICK 18X4 (TOURNIQUET CUFF) ×2 IMPLANT
CUFF TOURN SGL QUICK 24 (TOURNIQUET CUFF)
CUFF TRNQT CYL 24X4X16.5-23 (TOURNIQUET CUFF) IMPLANT
DECANTER SPIKE VIAL GLASS SM (MISCELLANEOUS) ×2 IMPLANT
DRAIN TLS ROUND 10FR (DRAIN) IMPLANT
DRAPE OEC MINIVIEW 54X84 (DRAPES) ×2 IMPLANT
DRAPE SURG 17X23 STRL (DRAPES) ×2 IMPLANT
DRILL 2.0 LNG QUICK RELEASE (BIT) ×2
DRSG XEROFORM 1X8 (GAUZE/BANDAGES/DRESSINGS) ×2 IMPLANT
GAUZE SPONGE 4X4 12PLY STRL (GAUZE/BANDAGES/DRESSINGS) IMPLANT
GAUZE SPONGE 4X4 12PLY STRL LF (GAUZE/BANDAGES/DRESSINGS) ×2 IMPLANT
GAUZE XEROFORM 1X8 LF (GAUZE/BANDAGES/DRESSINGS) IMPLANT
GLOVE BIO SURGEON STRL SZ7.5 (GLOVE) ×2 IMPLANT
GLOVE SRG 8 PF TXTR STRL LF DI (GLOVE) ×1 IMPLANT
GLOVE SURG UNDER POLY LF SZ8 (GLOVE) ×1
GOWN STRL REUS W/ TWL LRG LVL3 (GOWN DISPOSABLE) ×1 IMPLANT
GOWN STRL REUS W/ TWL XL LVL3 (GOWN DISPOSABLE) ×1 IMPLANT
GOWN STRL REUS W/TWL LRG LVL3 (GOWN DISPOSABLE) ×1
GOWN STRL REUS W/TWL XL LVL3 (GOWN DISPOSABLE) ×1
GUIDEWIRE ORTHO 0.054X6 (WIRE) ×6 IMPLANT
KIT BASIN OR (CUSTOM PROCEDURE TRAY) ×2 IMPLANT
KIT TURNOVER KIT B (KITS) ×2 IMPLANT
LOOP VESSEL MAXI BLUE (MISCELLANEOUS) IMPLANT
MANIFOLD NEPTUNE II (INSTRUMENTS) IMPLANT
NEEDLE 22X1 1/2 (OR ONLY) (NEEDLE) IMPLANT
NS IRRIG 1000ML POUR BTL (IV SOLUTION) ×4 IMPLANT
PACK ORTHO EXTREMITY (CUSTOM PROCEDURE TRAY) ×2 IMPLANT
PAD ARMBOARD 7.5X6 YLW CONV (MISCELLANEOUS) ×4 IMPLANT
PAD CAST 4YDX4 CTTN HI CHSV (CAST SUPPLIES) IMPLANT
PADDING CAST COTTON 4X4 STRL (CAST SUPPLIES)
PADDING CAST SYNTHETIC 4 (CAST SUPPLIES) ×2
PADDING CAST SYNTHETIC 4X4 STR (CAST SUPPLIES) ×2 IMPLANT
PLATE ACULOC 2 STANDARD LG R (Plate) ×2 IMPLANT
PLATE ACULOC 2 STD LG R (Plate) ×1 IMPLANT
SCREW BN FT 16X2.3XLCK HEX CRT (Screw) ×1 IMPLANT
SCREW CORT FT 18X2.3XLCK HEX (Screw) ×1 IMPLANT
SCREW CORTICAL LOCKING 2.3X16M (Screw) ×1 IMPLANT
SCREW CORTICAL LOCKING 2.3X18M (Screw) ×2 IMPLANT
SCREW CORTICAL LOCKING 2.3X20M (Screw) ×4 IMPLANT
SCREW FX18X2.3XSMTH LCK NS CRT (Screw) ×1 IMPLANT
SCREW FX20X2.3XSMTH LCK NS CRT (Screw) ×4 IMPLANT
SCREW HEX 3.5X15 NLCKG STRL (Screw) ×1 IMPLANT
SCREW HEX 3.5X15MM (Screw) ×2 IMPLANT
SCREW HEXALOBE NON-LOCK 3.5X14 (Screw) ×4 IMPLANT
SCREW HEXALOBE NON-LOCK 3.5X16 (Screw) ×2 IMPLANT
SPLINT PLASTER EXTRA FAST 3X15 (CAST SUPPLIES) ×1
SPLINT PLASTER GYPS XFAST 3X15 (CAST SUPPLIES) ×1 IMPLANT
SPONGE LAP 4X18 RFD (DISPOSABLE) IMPLANT
SUT ETHILON 2 0 FS 18 (SUTURE) ×2 IMPLANT
SUT ETHILON 4 0 PS 2 18 (SUTURE) ×4 IMPLANT
SUT MNCRL AB 4-0 PS2 18 (SUTURE) IMPLANT
SUT PROLENE 3 0 PS 2 (SUTURE) IMPLANT
SUT VIC AB 3-0 FS2 27 (SUTURE) IMPLANT
SUT VIC AB 4-0 PS2 18 (SUTURE) ×2 IMPLANT
SYR CONTROL 10ML LL (SYRINGE) ×2 IMPLANT
SYSTEM CHEST DRAIN TLS 7FR (DRAIN) IMPLANT
TOWEL GREEN STERILE (TOWEL DISPOSABLE) ×2 IMPLANT
TOWEL GREEN STERILE FF (TOWEL DISPOSABLE) ×2 IMPLANT
TUBE CONNECTING 12X1/4 (SUCTIONS) ×2 IMPLANT
TUBE EVACUATION TLS (MISCELLANEOUS) IMPLANT
UNDERPAD 30X36 HEAVY ABSORB (UNDERPADS AND DIAPERS) ×2 IMPLANT
WATER STERILE IRR 1000ML POUR (IV SOLUTION) IMPLANT

## 2021-02-05 NOTE — Anesthesia Postprocedure Evaluation (Signed)
Anesthesia Post Note  Patient: Jonathan Montgomery  Procedure(s) Performed: OPEN REDUCTION INTERNAL FIXATION (ORIF) DISTAL RADIAL FRACTURE and Irrigation & debridement of open fracture (Right )     Patient location during evaluation: PACU Anesthesia Type: Regional and General Level of consciousness: awake and alert Pain management: pain level controlled Vital Signs Assessment: post-procedure vital signs reviewed and stable Respiratory status: spontaneous breathing, nonlabored ventilation, respiratory function stable and patient connected to face mask oxygen Cardiovascular status: blood pressure returned to baseline and stable Postop Assessment: no apparent nausea or vomiting Anesthetic complications: no   No complications documented.  Last Vitals:  Vitals:   02/05/21 2040 02/05/21 2055  BP: 120/67 126/74  Pulse: 73 72  Resp: 15 16  Temp:    SpO2: 100% 100%    Last Pain:  Vitals:   02/05/21 2055  TempSrc:   PainSc: Asleep                 Tekelia Kareem,W. EDMOND

## 2021-02-05 NOTE — Anesthesia Preprocedure Evaluation (Addendum)
Anesthesia Evaluation  Patient identified by MRN, date of birth, ID band Patient awake    Reviewed: Allergy & Precautions, H&P , NPO status , Patient's Chart, lab work & pertinent test results  Airway Mallampati: II  TM Distance: >3 FB Neck ROM: Full    Dental no notable dental hx. (+) Teeth Intact, Dental Advisory Given   Pulmonary neg pulmonary ROS,    Pulmonary exam normal breath sounds clear to auscultation       Cardiovascular negative cardio ROS   Rhythm:Regular Rate:Normal     Neuro/Psych PSYCHIATRIC DISORDERS Pt states he has suicidal ideationsnegative neurological ROS     GI/Hepatic negative GI ROS, (+)     substance abuse  IV drug use,   Endo/Other  negative endocrine ROS  Renal/GU negative Renal ROS  negative genitourinary   Musculoskeletal  (+) narcotic dependent  Abdominal   Peds  Hematology negative hematology ROS (+)   Anesthesia Other Findings   Reproductive/Obstetrics negative OB ROS                           Anesthesia Physical Anesthesia Plan  ASA: II  Anesthesia Plan: General   Post-op Pain Management:  Regional for Post-op pain   Induction: Intravenous  PONV Risk Score and Plan: 2 and Propofol infusion, Midazolam, Ondansetron and Dexamethasone  Airway Management Planned: LMA  Additional Equipment:   Intra-op Plan:   Post-operative Plan: Extubation in OR  Informed Consent: I have reviewed the patients History and Physical, chart, labs and discussed the procedure including the risks, benefits and alternatives for the proposed anesthesia with the patient or authorized representative who has indicated his/her understanding and acceptance.     Dental advisory given  Plan Discussed with: CRNA  Anesthesia Plan Comments: (Pt states that he has had a previous injury to the operative arm that limits movement of his arm. He says that he can't raise his arm  above his hip. Despite his upper extremity deficit, I believe that a nerve block for postoperative pain relief is the best course of action. 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

## 2021-02-05 NOTE — ED Notes (Signed)
Pt currently being TTS. 

## 2021-02-05 NOTE — H&P (Addendum)
Jonathan Montgomery is an 30 y.o. male.   Chief Complaint: wrist fracture HPI: 30 year old male reportedly jumped out of a window earlier today injuring his right wrist.  Seen at [facility] emergency department where radiographs were taken revealing a right distal radius fracture.  He has a wound on the volar aspect of the wrist concerning for open fracture.  He reports previous injury to his hand that caused a lump type deformity at the dorsum of the hand.  He notes deformity to the wrist as well as pain at the wrist.  There is associated wound with bleeding  Case discussed with Lorre NickAnthony Allen, MD and his note from 02/05/2021 reviewed. Xrays viewed and interpreted by me: AP and lateral views of the right hand and wrist show extra-articular distal radius fracture with dorsal angulation and displacement. Labs reviewed: None  Allergies: NKDA  Past medical history: None    Family History: No family history on file.  Social History:   has no history on file for tobacco use, alcohol use, and drug use.  Medications: No medications prior to admission.    Results for orders placed or performed during the hospital encounter of 02/05/21 (from the past 48 hour(s))  Resp Panel by RT-PCR (Flu A&B, Covid) Nasopharyngeal Swab     Status: None   Collection Time: 02/05/21  1:49 PM   Specimen: Nasopharyngeal Swab; Nasopharyngeal(NP) swabs in vial transport medium  Result Value Ref Range   SARS Coronavirus 2 by RT PCR NEGATIVE NEGATIVE    Comment: (NOTE) SARS-CoV-2 target nucleic acids are NOT DETECTED.  The SARS-CoV-2 RNA is generally detectable in upper respiratory specimens during the acute phase of infection. The lowest concentration of SARS-CoV-2 viral copies this assay can detect is 138 copies/mL. A negative result does not preclude SARS-Cov-2 infection and should not be used as the sole basis for treatment or other patient management decisions. A negative result may occur with  improper specimen  collection/handling, submission of specimen other than nasopharyngeal swab, presence of viral mutation(s) within the areas targeted by this assay, and inadequate number of viral copies(<138 copies/mL). A negative result must be combined with clinical observations, patient history, and epidemiological information. The expected result is Negative.  Fact Sheet for Patients:  BloggerCourse.comhttps://www.fda.gov/media/152166/download  Fact Sheet for Healthcare Providers:  SeriousBroker.ithttps://www.fda.gov/media/152162/download  This test is no t yet approved or cleared by the [place] FDA and  has been authorized for detection and/or diagnosis of SARS-CoV-2 by FDA under an Emergency Use Authorization (EUA). This EUA will remain  in effect (meaning this test can be used) for the duration of the COVID-19 declaration under Section 564(b)(1) of the Act, 21 U.S.C.section 360bbb-3(b)(1), unless the authorization is terminated  or revoked sooner.       Influenza A by PCR NEGATIVE NEGATIVE   Influenza B by PCR NEGATIVE NEGATIVE    Comment: (NOTE) The Xpert Xpress SARS-CoV-2/FLU/RSV plus assay is intended as an aid in the diagnosis of influenza from Nasopharyngeal swab specimens and should not be used as a sole basis for treatment. Nasal washings and aspirates are unacceptable for Xpert Xpress SARS-CoV-2/FLU/RSV testing.  Fact Sheet for Patients: BloggerCourse.comhttps://www.fda.gov/media/152166/download  Fact Sheet for Healthcare Providers: SeriousBroker.ithttps://www.fda.gov/media/152162/download  This test is not yet approved or cleared by the [place] FDA and has been authorized for detection and/or diagnosis of SARS-CoV-2 by FDA under an Emergency Use Authorization (EUA). This EUA will remain in effect (meaning this test can be used) for the duration of the COVID-19 declaration under Section 564(b)(1)  of the Act, 21 U.S.C. section 360bbb-3(b)(1), unless the authorization is terminated or revoked.  Performed at [facility]  Lab, [address], [place], [place] [address]   Comprehensive metabolic panel     Status: Abnormal   Collection Time: 02/05/21  1:53 PM  Result Value Ref Range   Sodium 134 (L) 135 - 145 mmol/L   Potassium 2.7 (LL) 3.5 - 5.1 mmol/L    Comment: CRITICAL RESULT CALLED TO, READ BACK BY AND VERIFIED WITH: L.MEEKS RN @ 1527 02/05/2021 BY C.EDENS    Chloride 97 (L) 98 - 111 mmol/L   CO2 25 22 - 32 mmol/L   Glucose, Bld 103 (H) 70 - 99 mg/dL    Comment: Glucose reference range applies only to samples taken after fasting for at least 8 hours.   BUN 10 6 - 20 mg/dL   Creatinine, Ser 3.14 0.61 - 1.24 mg/dL   Calcium 8.8 (L) 8.9 - 10.3 mg/dL   Total Protein 6.3 (L) 6.5 - 8.1 g/dL   Albumin 3.6 3.5 - 5.0 g/dL   AST 970 (H) 15 - 41 U/L   ALT 100 (H) 0 - 44 U/L   Alkaline Phosphatase 57 38 - 126 U/L   Total Bilirubin 1.4 (H) 0.3 - 1.2 mg/dL   GFR, Estimated >26 >37 mL/min    Comment: (NOTE) Calculated using the CKD-EPI Creatinine Equation (2021)    Anion gap 12 5 - 15    Comment: Performed at [facility], [address] [place], [place] [address]  CBC     Status: None   Collection Time: 02/05/21  1:53 PM  Result Value Ref Range   WBC 7.3 4.0 - 10.5 K/uL   RBC 4.77 4.22 - 5.81 MIL/uL   Hemoglobin 13.3 13.0 - 17.0 g/dL   HCT 02.7 74.1 - 28.7 %   MCV 83.6 80.0 - 100.0 fL   MCH 27.9 26.0 - 34.0 pg   MCHC 33.3 30.0 - 36.0 g/dL   RDW 86.7 67.2 - 09.4 %   Platelets 281 150 - 400 K/uL   nRBC 0.0 0.0 - 0.2 %    Comment: Performed at [facility], [address], [place], [place] [address]  Ethanol     Status: None   Collection Time: 02/05/21  1:53 PM  Result Value Ref Range   Alcohol, Ethyl (B) <10 <10 mg/dL    Comment: (NOTE) Lowest detectable limit for serum alcohol is 10 mg/dL.  For medical purposes only. Performed at [facility], [address] [place], [place] [address]   Lactic acid, plasma     Status: Abnormal   Collection Time: 02/05/21  1:53 PM  Result  Value Ref Range   Lactic Acid, Venous 3.0 (HH) 0.5 - 1.9 mmol/L    Comment: CRITICAL RESULT CALLED TO, READ BACK BY AND VERIFIED WITH: L.MEEKS RN @ 1529 02/05/2021 BY C.EDENS Performed at [facility] Lab, [address], [place], [place] [address]   Protime-INR     Status: None   Collection Time: 02/05/21  1:53 PM  Result Value Ref Range   Prothrombin Time 13.5 11.4 - 15.2 seconds   INR 1.0 0.8 - 1.2    Comment: (NOTE) INR goal varies based on device and disease states. Performed at [facility], [address] [place], [place] [address]   Sample to Blood Bank     Status: None   Collection Time: 02/05/21  1:53 PM  Result Value Ref Range   Blood Bank Specimen SAMPLE AVAILABLE FOR TESTING    Sample Expiration      02/08/2021,2359 Performed at [facility] Lab, [address] [place], [place] [address]   I-Stat Chem 8, ED     Status: Abnormal   Collection Time: 02/05/21  2:00 PM  Result Value Ref Range   Sodium 134 (L) 135 - 145 mmol/L   Potassium 3.0 (L) 3.5 - 5.1 mmol/L   Chloride 95 (L) 98 - 111 mmol/L   BUN 12 6 - 20 mg/dL   Creatinine, Ser 8.11 0.61 - 1.24 mg/dL   Glucose, Bld 914 (H) 70 - 99 mg/dL    Comment: Glucose reference range applies only to samples taken after fasting for at least 8 hours.   Calcium, Ion 1.09 (L) 1.15 - 1.40 mmol/L   TCO2 27 22 - 32 mmol/L   Hemoglobin 13.3 13.0 - 17.0 g/dL   HCT 78.2 95.6 - 21.3 %    DG Wrist 2 Views Right  Result Date: 02/05/2021 CLINICAL DATA:  Recent 3 story fall with wrist deformity and pain, initial encounter EXAM: RIGHT WRIST - 2 VIEW COMPARISON:  None. FINDINGS: Comminuted fracture is noted in the distal radial metaphysis with posterior displacement and proximal overlap of the fracture fragments. No other focal abnormality is seen. IMPRESSION: Distal radial fracture as previously described stable from the prior exam. Electronically Signed   By: Alcide Clever M.D.   On: 02/05/2021 15:30   CT HEAD WO  CONTRAST  Result Date: 02/05/2021 CLINICAL DATA:  Fall from third story.  Drug use. EXAM: CT HEAD WITHOUT CONTRAST CT MAXILLOFACIAL WITHOUT CONTRAST CT CERVICAL SPINE WITHOUT CONTRAST CT CHEST, ABDOMEN AND PELVIS WITH CONTRAST TECHNIQUE: Contiguous axial images were obtained from the base of the skull through the vertex without intravenous contrast. Multidetector CT imaging of the maxillofacial structures was performed. Multiplanar CT image reconstructions were also generated. A small metallic BB was placed on the right temple in order to reliably differentiate right from left. Multidetector CT imaging of the cervical spine was performed without intravenous contrast. Multiplanar CT image reconstructions were also generated. Multidetector CT imaging of the chest, abdomen and pelvis was performed following the standard protocol during bolus administration of intravenous contrast. CONTRAST:  OMNIPAQUE IOHEXOL 300 MG/ML  SOLN COMPARISON:  None. FINDINGS: CT HEAD FINDINGS Brain: No evidence of large-territorial acute infarction. No parenchymal hemorrhage. No mass lesion. No extra-axial collection. No mass effect or midline shift. No hydrocephalus. Basilar cisterns are patent. Vascular: No hyperdense vessel. Skull: No acute fracture or focal lesion. Other: None. CT MAXILLOFACIAL FINDINGS Osseous: Comminuted bilateral nasal bone fracture with deviation to the left. No other acute displaced facial fracture. No destructive process. Sinuses/Orbits: Paranasal sinuses and mastoid air cells are clear. The orbits are unremarkable. Soft tissues: Negative. CT CERVICAL SPINE FINDINGS Alignment: Normal. Skull base and vertebrae: Bifid spinous processes. No acute fracture. No aggressive appearing focal osseous lesion or focal pathologic process. Soft tissues and spinal canal: No prevertebral fluid or swelling. No visible canal hematoma. Upper chest: Unremarkable. Other: None. CT CHEST FINDINGS Ports and Devices: None.  Lungs/airways: 3.8 cm area of hypodensity of the left hepatic lobe along the falciform ligament (3:52, 6:17). Subcentimeter hypodensity posterior to this (3:54). No pulmonary nodule. No pulmonary mass. No pulmonary contusion or laceration. No pneumatocele formation. The central airways are patent. Pleura: No pleural effusion. No pneumothorax. No hemothorax. Lymph Nodes: No mediastinal, hilar, or axillary lymphadenopathy. Mediastinum: No pneumomediastinum.  No aortic injury or mediastinal hematoma. The thoracic aorta is normal in caliber. The heart is normal in size. No significant pericardial effusion. The esophagus is unremarkable. The thyroid is unremarkable. Chest Wall / Breasts: No chest wall mass. Musculoskeletal: No acute rib or sternal fracture. No spinal fracture. CT ABDOMEN AND PELVIS FINDINGS Liver: Not enlarged. No focal lesion. No laceration or subcapsular hematoma. Biliary System: The gallbladder is otherwise unremarkable with no radio-opaque gallstones. No biliary ductal dilatation. Pancreas: Normal pancreatic contour. No main pancreatic duct dilatation. Spleen: Not enlarged. No focal lesion. No laceration, subcapsular hematoma, or vascular injury. Adrenal Glands: No nodularity bilaterally. Kidneys: Bilateral kidneys enhance symmetrically. No hydronephrosis. No contusion, laceration, or subcapsular hematoma. No injury to the vascular structures or collecting systems. No hydroureter. The urinary bladder is unremarkable. Bowel: No small or large bowel wall thickening or dilatation. The appendix is unremarkable. Mesentery, Omentum, and Peritoneum: No simple free fluid ascites. No pneumoperitoneum. No hemoperitoneum. No mesenteric hematoma identified. No organized fluid collection. Pelvic Organs: Normal. Lymph Nodes: No abdominal, pelvic, inguinal lymphadenopathy. Vasculature: No abdominal aorta or iliac aneurysm. No active contrast extravasation or pseudoaneurysm. Musculoskeletal: Subcutaneus soft tissue  edema with small hematoma formation of the right hip (3:24). No acute pelvic fracture. No spinal fracture. IMPRESSION: 1. No acute intracranial abnormality. 2. Nasal bone fracture. 3. No acute displaced fracture or traumatic listhesis of the cervical spine. 4. A 3.8 cm left hepatic lobe hypodensity along the falciform ligament likely represents focal fatty infiltration. Differential diagnosis includes less likely hepatic traumatic injury. Correlate with physical exam. 5. Otherwise no acute traumatic injury to the chest, abdomen, or pelvis. 6. No acute fracture or traumatic malalignment of the thoracic or lumbar spine. These results were called by telephone at the time of interpretation on 02/05/2021 at 3:14 pm to provider Lorre Nick , who verbally acknowledged these results. Electronically Signed   By: Tish Frederickson M.D.   On: 02/05/2021 15:21   CT Chest W Contrast  Result Date: 02/05/2021 CLINICAL DATA:  Fall from third story.  Drug use. EXAM: CT HEAD WITHOUT CONTRAST CT MAXILLOFACIAL WITHOUT CONTRAST CT CERVICAL SPINE WITHOUT CONTRAST CT CHEST, ABDOMEN AND PELVIS WITH CONTRAST TECHNIQUE: Contiguous axial images were obtained from the base of the skull through the vertex without intravenous contrast. Multidetector CT imaging of the maxillofacial structures was performed. Multiplanar CT image reconstructions were also generated. A small metallic BB was placed on the right temple in order to reliably differentiate right from left. Multidetector CT imaging of the cervical spine was performed without intravenous contrast. Multiplanar CT image reconstructions were also generated. Multidetector CT imaging of the chest, abdomen and pelvis was performed following the standard protocol during bolus administration of intravenous contrast. CONTRAST:  OMNIPAQUE IOHEXOL 300 MG/ML  SOLN COMPARISON:  None. FINDINGS: CT HEAD FINDINGS Brain: No evidence of large-territorial acute infarction. No parenchymal hemorrhage.  No mass lesion. No extra-axial collection. No mass effect or midline shift. No hydrocephalus. Basilar cisterns are patent. Vascular: No hyperdense vessel. Skull: No acute fracture or focal lesion. Other: None. CT MAXILLOFACIAL FINDINGS Osseous: Comminuted bilateral nasal bone fracture with deviation to the left. No other acute displaced facial fracture. No destructive process. Sinuses/Orbits: Paranasal sinuses and mastoid air cells are clear. The orbits are unremarkable. Soft tissues: Negative. CT CERVICAL SPINE FINDINGS Alignment: Normal. Skull base and vertebrae: Bifid spinous processes. No acute fracture. No aggressive appearing focal osseous lesion or focal pathologic process. Soft tissues and spinal canal: No prevertebral fluid or swelling. No visible  canal hematoma. Upper chest: Unremarkable. Other: None. CT CHEST FINDINGS Ports and Devices: None. Lungs/airways: 3.8 cm area of hypodensity of the left hepatic lobe along the falciform ligament (3:52, 6:17). Subcentimeter hypodensity posterior to this (3:54). No pulmonary nodule. No pulmonary mass. No pulmonary contusion or laceration. No pneumatocele formation. The central airways are patent. Pleura: No pleural effusion. No pneumothorax. No hemothorax. Lymph Nodes: No mediastinal, hilar, or axillary lymphadenopathy. Mediastinum: No pneumomediastinum. No aortic injury or mediastinal hematoma. The thoracic aorta is normal in caliber. The heart is normal in size. No significant pericardial effusion. The esophagus is unremarkable. The thyroid is unremarkable. Chest Wall / Breasts: No chest wall mass. Musculoskeletal: No acute rib or sternal fracture. No spinal fracture. CT ABDOMEN AND PELVIS FINDINGS Liver: Not enlarged. No focal lesion. No laceration or subcapsular hematoma. Biliary System: The gallbladder is otherwise unremarkable with no radio-opaque gallstones. No biliary ductal dilatation. Pancreas: Normal pancreatic contour. No main pancreatic duct dilatation.  Spleen: Not enlarged. No focal lesion. No laceration, subcapsular hematoma, or vascular injury. Adrenal Glands: No nodularity bilaterally. Kidneys: Bilateral kidneys enhance symmetrically. No hydronephrosis. No contusion, laceration, or subcapsular hematoma. No injury to the vascular structures or collecting systems. No hydroureter. The urinary bladder is unremarkable. Bowel: No small or large bowel wall thickening or dilatation. The appendix is unremarkable. Mesentery, Omentum, and Peritoneum: No simple free fluid ascites. No pneumoperitoneum. No hemoperitoneum. No mesenteric hematoma identified. No organized fluid collection. Pelvic Organs: Normal. Lymph Nodes: No abdominal, pelvic, inguinal lymphadenopathy. Vasculature: No abdominal aorta or iliac aneurysm. No active contrast extravasation or pseudoaneurysm. Musculoskeletal: Subcutaneus soft tissue edema with small hematoma formation of the right hip (3:24). No acute pelvic fracture. No spinal fracture. IMPRESSION: 1. No acute intracranial abnormality. 2. Nasal bone fracture. 3. No acute displaced fracture or traumatic listhesis of the cervical spine. 4. A 3.8 cm left hepatic lobe hypodensity along the falciform ligament likely represents focal fatty infiltration. Differential diagnosis includes less likely hepatic traumatic injury. Correlate with physical exam. 5. Otherwise no acute traumatic injury to the chest, abdomen, or pelvis. 6. No acute fracture or traumatic malalignment of the thoracic or lumbar spine. These results were called by telephone at the time of interpretation on 02/05/2021 at 3:14 pm to provider Lorre Nick , who verbally acknowledged these results. Electronically Signed   By: Tish Frederickson M.D.   On: 02/05/2021 15:21   CT CERVICAL SPINE WO CONTRAST  Result Date: 02/05/2021 CLINICAL DATA:  Fall from third story.  Drug use. EXAM: CT HEAD WITHOUT CONTRAST CT MAXILLOFACIAL WITHOUT CONTRAST CT CERVICAL SPINE WITHOUT CONTRAST CT CHEST,  ABDOMEN AND PELVIS WITH CONTRAST TECHNIQUE: Contiguous axial images were obtained from the base of the skull through the vertex without intravenous contrast. Multidetector CT imaging of the maxillofacial structures was performed. Multiplanar CT image reconstructions were also generated. A small metallic BB was placed on the right temple in order to reliably differentiate right from left. Multidetector CT imaging of the cervical spine was performed without intravenous contrast. Multiplanar CT image reconstructions were also generated. Multidetector CT imaging of the chest, abdomen and pelvis was performed following the standard protocol during bolus administration of intravenous contrast. CONTRAST:  OMNIPAQUE IOHEXOL 300 MG/ML  SOLN COMPARISON:  None. FINDINGS: CT HEAD FINDINGS Brain: No evidence of large-territorial acute infarction. No parenchymal hemorrhage. No mass lesion. No extra-axial collection. No mass effect or midline shift. No hydrocephalus. Basilar cisterns are patent. Vascular: No hyperdense vessel. Skull: No acute fracture or focal lesion. Other: None. CT  MAXILLOFACIAL FINDINGS Osseous: Comminuted bilateral nasal bone fracture with deviation to the left. No other acute displaced facial fracture. No destructive process. Sinuses/Orbits: Paranasal sinuses and mastoid air cells are clear. The orbits are unremarkable. Soft tissues: Negative. CT CERVICAL SPINE FINDINGS Alignment: Normal. Skull base and vertebrae: Bifid spinous processes. No acute fracture. No aggressive appearing focal osseous lesion or focal pathologic process. Soft tissues and spinal canal: No prevertebral fluid or swelling. No visible canal hematoma. Upper chest: Unremarkable. Other: None. CT CHEST FINDINGS Ports and Devices: None. Lungs/airways: 3.8 cm area of hypodensity of the left hepatic lobe along the falciform ligament (3:52, 6:17). Subcentimeter hypodensity posterior to this (3:54). No pulmonary nodule. No pulmonary mass. No  pulmonary contusion or laceration. No pneumatocele formation. The central airways are patent. Pleura: No pleural effusion. No pneumothorax. No hemothorax. Lymph Nodes: No mediastinal, hilar, or axillary lymphadenopathy. Mediastinum: No pneumomediastinum. No aortic injury or mediastinal hematoma. The thoracic aorta is normal in caliber. The heart is normal in size. No significant pericardial effusion. The esophagus is unremarkable. The thyroid is unremarkable. Chest Wall / Breasts: No chest wall mass. Musculoskeletal: No acute rib or sternal fracture. No spinal fracture. CT ABDOMEN AND PELVIS FINDINGS Liver: Not enlarged. No focal lesion. No laceration or subcapsular hematoma. Biliary System: The gallbladder is otherwise unremarkable with no radio-opaque gallstones. No biliary ductal dilatation. Pancreas: Normal pancreatic contour. No main pancreatic duct dilatation. Spleen: Not enlarged. No focal lesion. No laceration, subcapsular hematoma, or vascular injury. Adrenal Glands: No nodularity bilaterally. Kidneys: Bilateral kidneys enhance symmetrically. No hydronephrosis. No contusion, laceration, or subcapsular hematoma. No injury to the vascular structures or collecting systems. No hydroureter. The urinary bladder is unremarkable. Bowel: No small or large bowel wall thickening or dilatation. The appendix is unremarkable. Mesentery, Omentum, and Peritoneum: No simple free fluid ascites. No pneumoperitoneum. No hemoperitoneum. No mesenteric hematoma identified. No organized fluid collection. Pelvic Organs: Normal. Lymph Nodes: No abdominal, pelvic, inguinal lymphadenopathy. Vasculature: No abdominal aorta or iliac aneurysm. No active contrast extravasation or pseudoaneurysm. Musculoskeletal: Subcutaneus soft tissue edema with small hematoma formation of the right hip (3:24). No acute pelvic fracture. No spinal fracture. IMPRESSION: 1. No acute intracranial abnormality. 2. Nasal bone fracture. 3. No acute displaced  fracture or traumatic listhesis of the cervical spine. 4. A 3.8 cm left hepatic lobe hypodensity along the falciform ligament likely represents focal fatty infiltration. Differential diagnosis includes less likely hepatic traumatic injury. Correlate with physical exam. 5. Otherwise no acute traumatic injury to the chest, abdomen, or pelvis. 6. No acute fracture or traumatic malalignment of the thoracic or lumbar spine. These results were called by telephone at the time of interpretation on 02/05/2021 at 3:14 pm to provider Lorre Nick , who verbally acknowledged these results. Electronically Signed   By: Tish Frederickson M.D.   On: 02/05/2021 15:21   CT ABDOMEN PELVIS W CONTRAST  Result Date: 02/05/2021 CLINICAL DATA:  Fall from third story.  Drug use. EXAM: CT HEAD WITHOUT CONTRAST CT MAXILLOFACIAL WITHOUT CONTRAST CT CERVICAL SPINE WITHOUT CONTRAST CT CHEST, ABDOMEN AND PELVIS WITH CONTRAST TECHNIQUE: Contiguous axial images were obtained from the base of the skull through the vertex without intravenous contrast. Multidetector CT imaging of the maxillofacial structures was performed. Multiplanar CT image reconstructions were also generated. A small metallic BB was placed on the right temple in order to reliably differentiate right from left. Multidetector CT imaging of the cervical spine was performed without intravenous contrast. Multiplanar CT image reconstructions were also generated. Multidetector CT imaging  of the chest, abdomen and pelvis was performed following the standard protocol during bolus administration of intravenous contrast. CONTRAST:  OMNIPAQUE IOHEXOL 300 MG/ML  SOLN COMPARISON:  None. FINDINGS: CT HEAD FINDINGS Brain: No evidence of large-territorial acute infarction. No parenchymal hemorrhage. No mass lesion. No extra-axial collection. No mass effect or midline shift. No hydrocephalus. Basilar cisterns are patent. Vascular: No hyperdense vessel. Skull: No acute fracture or focal  lesion. Other: None. CT MAXILLOFACIAL FINDINGS Osseous: Comminuted bilateral nasal bone fracture with deviation to the left. No other acute displaced facial fracture. No destructive process. Sinuses/Orbits: Paranasal sinuses and mastoid air cells are clear. The orbits are unremarkable. Soft tissues: Negative. CT CERVICAL SPINE FINDINGS Alignment: Normal. Skull base and vertebrae: Bifid spinous processes. No acute fracture. No aggressive appearing focal osseous lesion or focal pathologic process. Soft tissues and spinal canal: No prevertebral fluid or swelling. No visible canal hematoma. Upper chest: Unremarkable. Other: None. CT CHEST FINDINGS Ports and Devices: None. Lungs/airways: 3.8 cm area of hypodensity of the left hepatic lobe along the falciform ligament (3:52, 6:17). Subcentimeter hypodensity posterior to this (3:54). No pulmonary nodule. No pulmonary mass. No pulmonary contusion or laceration. No pneumatocele formation. The central airways are patent. Pleura: No pleural effusion. No pneumothorax. No hemothorax. Lymph Nodes: No mediastinal, hilar, or axillary lymphadenopathy. Mediastinum: No pneumomediastinum. No aortic injury or mediastinal hematoma. The thoracic aorta is normal in caliber. The heart is normal in size. No significant pericardial effusion. The esophagus is unremarkable. The thyroid is unremarkable. Chest Wall / Breasts: No chest wall mass. Musculoskeletal: No acute rib or sternal fracture. No spinal fracture. CT ABDOMEN AND PELVIS FINDINGS Liver: Not enlarged. No focal lesion. No laceration or subcapsular hematoma. Biliary System: The gallbladder is otherwise unremarkable with no radio-opaque gallstones. No biliary ductal dilatation. Pancreas: Normal pancreatic contour. No main pancreatic duct dilatation. Spleen: Not enlarged. No focal lesion. No laceration, subcapsular hematoma, or vascular injury. Adrenal Glands: No nodularity bilaterally. Kidneys: Bilateral kidneys enhance symmetrically.  No hydronephrosis. No contusion, laceration, or subcapsular hematoma. No injury to the vascular structures or collecting systems. No hydroureter. The urinary bladder is unremarkable. Bowel: No small or large bowel wall thickening or dilatation. The appendix is unremarkable. Mesentery, Omentum, and Peritoneum: No simple free fluid ascites. No pneumoperitoneum. No hemoperitoneum. No mesenteric hematoma identified. No organized fluid collection. Pelvic Organs: Normal. Lymph Nodes: No abdominal, pelvic, inguinal lymphadenopathy. Vasculature: No abdominal aorta or iliac aneurysm. No active contrast extravasation or pseudoaneurysm. Musculoskeletal: Subcutaneus soft tissue edema with small hematoma formation of the right hip (3:24). No acute pelvic fracture. No spinal fracture. IMPRESSION: 1. No acute intracranial abnormality. 2. Nasal bone fracture. 3. No acute displaced fracture or traumatic listhesis of the cervical spine. 4. A 3.8 cm left hepatic lobe hypodensity along the falciform ligament likely represents focal fatty infiltration. Differential diagnosis includes less likely hepatic traumatic injury. Correlate with physical exam. 5. Otherwise no acute traumatic injury to the chest, abdomen, or pelvis. 6. No acute fracture or traumatic malalignment of the thoracic or lumbar spine. These results were called by telephone at the time of interpretation on 02/05/2021 at 3:14 pm to provider Lorre Nick , who verbally acknowledged these results. Electronically Signed   By: Tish Frederickson M.D.   On: 02/05/2021 15:21   DG Pelvis Portable  Result Date: 02/05/2021 CLINICAL DATA:  Patient jumped from third Floor. EXAM: PORTABLE PELVIS 1-2 VIEWS COMPARISON:  None. FINDINGS: There is no evidence of pelvic fracture or diastasis. No pelvic bone lesions are  seen. IMPRESSION: Negative. Electronically Signed   By: Sherian Rein M.D.   On: 02/05/2021 14:10   DG Hand 2 View Right  Result Date: 02/05/2021 CLINICAL DATA:  Fall  from 3 stories up with wrist pain and deformity, initial encounter EXAM: RIGHT HAND - 2 VIEW COMPARISON:  None. FINDINGS: There is a comminuted fracture of the distal radius identified involving the metaphysis. Posterior displacement and overlap is noted at the fracture site. No lunate or perilunate dislocation is seen. Mild soft tissue swelling is noted. No ulnar abnormality is seen. IMPRESSION: Comminuted distal radial metaphyseal fracture with posterior displacement and overlap proximally. Electronically Signed   By: Alcide Clever M.D.   On: 02/05/2021 15:29   DG Chest Port 1 View  Result Date: 02/05/2021 CLINICAL DATA:  Patient jumped from third Floor. EXAM: PORTABLE CHEST 1 VIEW COMPARISON:  None. FINDINGS: The heart size and mediastinal contours are within normal limits. Both lungs are clear. The visualized skeletal structures are unremarkable. IMPRESSION: No active disease. Electronically Signed   By: Sherian Rein M.D.   On: 02/05/2021 14:10   DG MINI C-ARM IMAGE ONLY  Result Date: 02/05/2021 There is no interpretation for this exam.  This order is for images obtained during a surgical procedure.  Please See "Surgeries" Tab for more information regarding the procedure.   CT Maxillofacial WO CM  Result Date: 02/05/2021 CLINICAL DATA:  Fall from third story.  Drug use. EXAM: CT HEAD WITHOUT CONTRAST CT MAXILLOFACIAL WITHOUT CONTRAST CT CERVICAL SPINE WITHOUT CONTRAST CT CHEST, ABDOMEN AND PELVIS WITH CONTRAST TECHNIQUE: Contiguous axial images were obtained from the base of the skull through the vertex without intravenous contrast. Multidetector CT imaging of the maxillofacial structures was performed. Multiplanar CT image reconstructions were also generated. A small metallic BB was placed on the right temple in order to reliably differentiate right from left. Multidetector CT imaging of the cervical spine was performed without intravenous contrast. Multiplanar CT image reconstructions were also  generated. Multidetector CT imaging of the chest, abdomen and pelvis was performed following the standard protocol during bolus administration of intravenous contrast. CONTRAST:  OMNIPAQUE IOHEXOL 300 MG/ML  SOLN COMPARISON:  None. FINDINGS: CT HEAD FINDINGS Brain: No evidence of large-territorial acute infarction. No parenchymal hemorrhage. No mass lesion. No extra-axial collection. No mass effect or midline shift. No hydrocephalus. Basilar cisterns are patent. Vascular: No hyperdense vessel. Skull: No acute fracture or focal lesion. Other: None. CT MAXILLOFACIAL FINDINGS Osseous: Comminuted bilateral nasal bone fracture with deviation to the left. No other acute displaced facial fracture. No destructive process. Sinuses/Orbits: Paranasal sinuses and mastoid air cells are clear. The orbits are unremarkable. Soft tissues: Negative. CT CERVICAL SPINE FINDINGS Alignment: Normal. Skull base and vertebrae: Bifid spinous processes. No acute fracture. No aggressive appearing focal osseous lesion or focal pathologic process. Soft tissues and spinal canal: No prevertebral fluid or swelling. No visible canal hematoma. Upper chest: Unremarkable. Other: None. CT CHEST FINDINGS Ports and Devices: None. Lungs/airways: 3.8 cm area of hypodensity of the left hepatic lobe along the falciform ligament (3:52, 6:17). Subcentimeter hypodensity posterior to this (3:54). No pulmonary nodule. No pulmonary mass. No pulmonary contusion or laceration. No pneumatocele formation. The central airways are patent. Pleura: No pleural effusion. No pneumothorax. No hemothorax. Lymph Nodes: No mediastinal, hilar, or axillary lymphadenopathy. Mediastinum: No pneumomediastinum. No aortic injury or mediastinal hematoma. The thoracic aorta is normal in caliber. The heart is normal in size. No significant pericardial effusion. The esophagus is unremarkable. The thyroid is  unremarkable. Chest Wall / Breasts: No chest wall mass. Musculoskeletal: No  acute rib or sternal fracture. No spinal fracture. CT ABDOMEN AND PELVIS FINDINGS Liver: Not enlarged. No focal lesion. No laceration or subcapsular hematoma. Biliary System: The gallbladder is otherwise unremarkable with no radio-opaque gallstones. No biliary ductal dilatation. Pancreas: Normal pancreatic contour. No main pancreatic duct dilatation. Spleen: Not enlarged. No focal lesion. No laceration, subcapsular hematoma, or vascular injury. Adrenal Glands: No nodularity bilaterally. Kidneys: Bilateral kidneys enhance symmetrically. No hydronephrosis. No contusion, laceration, or subcapsular hematoma. No injury to the vascular structures or collecting systems. No hydroureter. The urinary bladder is unremarkable. Bowel: No small or large bowel wall thickening or dilatation. The appendix is unremarkable. Mesentery, Omentum, and Peritoneum: No simple free fluid ascites. No pneumoperitoneum. No hemoperitoneum. No mesenteric hematoma identified. No organized fluid collection. Pelvic Organs: Normal. Lymph Nodes: No abdominal, pelvic, inguinal lymphadenopathy. Vasculature: No abdominal aorta or iliac aneurysm. No active contrast extravasation or pseudoaneurysm. Musculoskeletal: Subcutaneus soft tissue edema with small hematoma formation of the right hip (3:24). No acute pelvic fracture. No spinal fracture. IMPRESSION: 1. No acute intracranial abnormality. 2. Nasal bone fracture. 3. No acute displaced fracture or traumatic listhesis of the cervical spine. 4. A 3.8 cm left hepatic lobe hypodensity along the falciform ligament likely represents focal fatty infiltration. Differential diagnosis includes less likely hepatic traumatic injury. Correlate with physical exam. 5. Otherwise no acute traumatic injury to the chest, abdomen, or pelvis. 6. No acute fracture or traumatic malalignment of the thoracic or lumbar spine. These results were called by telephone at the time of interpretation on 02/05/2021 at 3:14 pm to provider  Lorre Nick , who verbally acknowledged these results. Electronically Signed   By: Tish Frederickson M.D.   On: 02/05/2021 15:21     A comprehensive review of systems was negative. Review of Systems: No fevers, chills, night sweats, chest pain, shortness of breath, nausea, vomiting, diarrhea, constipation, easy bleeding or bruising, headaches, dizziness, vision changes, fainting.   Blood pressure (!) 154/92, pulse 92, temperature 98.3 F (36.8 C), temperature source Temporal, resp. rate 17, height 5\' 11"  (1.803 m), weight 90.7 kg, SpO2 99 %.  General appearance: alert, cooperative and appears stated age Head: Normocephalic, without obvious abnormality, atraumatic Neck: supple, symmetrical, trachea midline Resp: clear to auscultation bilaterally Cardio: regular rate and rhythm Extremities: Intact sensation and capillary refill all digits but in the right hand feels tingly..  Weak motion of the digit secondary to pain.  Visible deformity at the wrist.  There is a prominence on the dorsum of the hand over the small and ring finger CMC joints.  This is nontender to palpation.  Compartments are soft.  There is a laceration at the volar aspect of the wrist.  Sore at elbow.  Mild tenderness at lateral epicondyle. Pulses: 2+ and symmetric Skin: Skin color, texture, turgor normal. No rashes or lesions Neurologic: Grossly normal Incision/Wound: As above  Assessment/Plan Right distal radius fracture, possibly open.  Recommend operative irrigation and debridement with open reduction internal fixation.  Risks, benefits and alternatives of surgery were discussed including risks of blood loss, infection, damage to nerves/vessels/tendons/ligament/bone, failure of surgery, need for additional surgery, complication with wound healing, stiffness, nonunion, malunion.  He voiced understanding of these risks and elected to proceed.    02/05/2021, 5:25 PM

## 2021-02-05 NOTE — ED Notes (Signed)
K+ 2.7 critical value reported to Dr. Freida Busman

## 2021-02-05 NOTE — ED Notes (Signed)
Patient transported to CT 

## 2021-02-05 NOTE — ED Notes (Signed)
Pt to ED via EMS after a fall from 3rd story. Patient had been using meth and believed the cops were chasing him, and attempted to escape from a 3rd story. 16G IV in left hand, multiple facial abrasions. C-collar and R wrist splint in place, obvious open fracture of R wrist.

## 2021-02-05 NOTE — ED Provider Notes (Signed)
  ED Course/Procedures   .Marland KitchenLaceration Repair  Date/Time: 02/05/2021 4:27 PM Performed by: Glyn Ade, MD Authorized by: Gwyneth Sprout, MD   Consent:    Consent obtained:  Verbal   Consent given by:  Patient   Risks, benefits, and alternatives were discussed: yes     Risks discussed:  Infection, need for additional repair, nerve damage, poor wound healing, poor cosmetic result, pain, retained foreign body, tendon damage and vascular damage   Alternatives discussed:  No treatment, delayed treatment and referral Universal protocol:    Patient identity confirmed:  Verbally with patient Anesthesia:    Anesthesia method:  Topical application and local infiltration   Local anesthetic:  Lidocaine 1% w/o epi Laceration details:    Location:  Face   Face location:  R eyebrow   Length (cm):  4   Depth (mm):  3 Pre-procedure details:    Preparation:  Patient was prepped and draped in usual sterile fashion Exploration:    Hemostasis achieved with:  Direct pressure   Wound exploration: wound explored through full range of motion and entire depth of wound visualized     Contaminated: no   Treatment:    Amount of cleaning:  Extensive   Irrigation solution:  Sterile water   Irrigation volume:    Irrigation method:  Syringe   Undermining:  Extensive Skin repair:    Repair method:  Sutures   Suture size:  5-0   Suture material:  Fast-absorbing gut   Suture technique:  Simple interrupted   Number of sutures:  15 Approximation:    Approximation:  Close Repair type:    Repair type:  Intermediate Post-procedure details:    Dressing:  Sterile dressing   Procedure completion:  Tolerated well, no immediate complications    MDM   Care of patient Rhian Funari assumed from previous provider at 1500.  See their note for further details.  Briefly, 30 y.o. male with PMH below presents with open wrist fracture. Endorsing SA by jumping off a building.   No past medical history  on file. Vitals:   02/05/21 1500 02/05/21 1536  BP: (!) 123/109 110/67  Pulse: (!) 38 91  Resp: (!) 21 13  Temp:    SpO2: 98% 99%     Plan at time of Handoff:  Pending operative management of RUE. Will return to ED for psych consult after procedure.    MDM/ED Course: Laceration was repaired as above without difficulty.  Patient still in operative suite at this time.  Plan from previous provider was for behavioral health consultation after returning from operative suite given reported jump from building suicide attempt.  Notably, patient was actively intoxicated when he made the above claim.        Glyn Ade, MD 02/05/21 4034    Gwyneth Sprout, MD 02/05/21 2354

## 2021-02-05 NOTE — ED Provider Notes (Signed)
[facility] EMERGENCY DEPARTMENT Provider Note   CSN: [id number] Arrival date & time: 02/05/21  1337     History No chief complaint on file.   Jonathan Montgomery is a 30 y.o. male.  30 year old male presents after falling approximately 20 feet while trying to climb out of the building.  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  Denies any alcohol use.  Fell onto his left wrist he complains of severe pain to his right wrist at this time.  Also notes some left ankle pain as well 2.  Denies any distal numbness tingling to his upper or lower extremities.  No chest or abdominal discomfort.  Did strike his head and does have a laceration above the right eyebrow.  Denies any neck discomfort.  EMS called and patient placed in c-collar and backboard and transported here        No past medical history on file.  There are no problems to display for this patient.        No family history on file.     Home Medications Prior to Admission medications   Not on File    Allergies    Patient has no allergy information on record.  Review of Systems   Review of Systems  All other systems reviewed and are negative.   Physical Exam Updated Vital Signs There were no vitals taken for this visit.  Physical Exam Vitals and nursing note reviewed.  Constitutional:      General: He is not in acute distress.    Appearance: Normal appearance. He is well-developed. He is not toxic-appearing.  HENT:     Head:   Eyes:     General: Lids are normal.     Conjunctiva/sclera: Conjunctivae normal.     Pupils: Pupils are equal, round, and reactive to light.  Neck:     Thyroid: No thyroid mass.     Trachea: No tracheal deviation.  Cardiovascular:     Rate and Rhythm: Normal rate and regular rhythm.     Heart sounds: Normal heart sounds. No murmur heard. No gallop.   Pulmonary:     Effort: Pulmonary effort is normal. No respiratory distress.     Breath sounds:  Normal breath sounds. No stridor. No decreased breath sounds, wheezing, rhonchi or rales.  Abdominal:     General: Bowel sounds are normal. There is no distension.     Palpations: Abdomen is soft.     Tenderness: There is no abdominal tenderness. There is no rebound.  Musculoskeletal:        General: Normal range of motion.     Cervical back: Normal range of motion and neck supple.     Left ankle: Tenderness present. Normal range of motion.     Comments: Neurovascular status intact at right hand.  Does have an abrasion noted at the right wrist concerning for possible open fracture  Skin:    General: Skin is warm and dry.     Findings: No abrasion or rash.  Neurological:     General: No focal deficit present.     Mental Status: He is alert and oriented to person, place, and time.     GCS: GCS eye subscore is 4. GCS verbal subscore is 5. GCS motor subscore is 6.     Cranial Nerves: Cranial nerves are intact. No cranial nerve deficit.     Sensory: No sensory deficit.     Motor: Motor function is intact. No weakness.  Psychiatric:  Attention and Perception: Attention normal.        Mood and Affect: Affect is labile.        Speech: Speech is rapid and pressured.        Behavior: Behavior is hyperactive.     ED Results / Procedures / Treatments   Labs (all labs ordered are listed, but only abnormal results are displayed) Labs Reviewed  RESP PANEL BY RT-PCR (FLU A&B, COVID) ARPGX2  COMPREHENSIVE METABOLIC PANEL  CBC  ETHANOL  URINALYSIS, ROUTINE W REFLEX MICROSCOPIC  LACTIC ACID, PLASMA  PROTIME-INR  I-STAT CHEM 8, ED  SAMPLE TO BLOOD BANK    EKG None  Radiology No results found.  Procedures Procedures   Medications Ordered in ED Medications - No data to display  ED Course  I have reviewed the triage vital signs and the nursing notes.  Pertinent labs & imaging results that were available during my care of the patient were reviewed by me and considered in my  medical decision making (see chart for details).    MDM Rules/Calculators/A&P                          Patient's tetanus status updated.  Given 2 g of Ancef for likely open fracture. Xray confirms this D/w hand md, will see pt Plan to go to or and return to ed for psych eval Final Clinical Impression(s) / ED Diagnoses Final diagnoses:  Trauma  Trauma    Rx / DC Orders ED Discharge Orders    None       Lorre Nick, MD 02/08/21 1136

## 2021-02-05 NOTE — Discharge Instructions (Addendum)
[facility] Instructions Hand Surgery  Wound Care: Keep your hand elevated above the level of your heart.  Do not allow it to dangle by your side.  Keep the dressing dry and do not remove it unless your doctor advises you to do so.  He will usually change it at the time of your post-op visit.  Moving your fingers is advised to stimulate circulation but will depend on the site of your surgery.  If you have a splint applied, your doctor will advise you regarding movement.  Activity: Do not drive or operate machinery today.  Rest today and then you may return to your normal activity and work as indicated by your physician.  Diet:  Drink liquids today or eat a light diet.  You may resume a regular diet tomorrow.    General expectations: Pain for two to three days. Fingers may become slightly swollen.  Call your doctor if any of the following occur: Severe pain not relieved by pain medication. Elevated temperature. Dressing soaked with blood. Inability to move fingers. White or bluish color to fingers.   PLEASE CALL THE OFFICE OF DR. KUZMA TO DISCUSS ONGOING EVALUATION AND MANAGEMENT.  PATIENT WILL REQUIRE OUTPATIENT SUBSTANCE USE ASSISTANCE FOR WITHDRAWAL SYMPTOMS AND ONGOING MANAGEMENT.  PLEASE STOP THE CAR EVERY 2-3 HOURS WHILE DRIVING HOME TO ALLOW PATIENT TO AMBULATE OUTSIDE FOR MINUTE OR TWO BEFORE RESUMING DRIVE.  PICK UP YOUR PRESCRIPTIONS BEFORE LEAVING TOWN.  GO TO THE EMERGENCY DEPARTMENT SHOULD HE DEVELOP ANY NEW OR WORSENING SYMPTOMS.

## 2021-02-05 NOTE — ED Notes (Signed)
Attempted to call pt mother per pt request. Number provided did not work

## 2021-02-05 NOTE — Progress Notes (Signed)
Pt transported back to ED by Miguel Aschoff, RN for psychiatric evaluation/hold. Pt is in no distress, calm and compliant. Vital signs stable. 02/05/2021 @ 2241 Jonathan Montgomery

## 2021-02-05 NOTE — Progress Notes (Signed)
Orthopedic Tech Progress Note Patient Details:  Jonathan Montgomery 10-04-1991 [id number] Level 2 Trauma not currently needed Patient ID: Jonathan Montgomery, male   DOB: July 08, 1991, 30 y.o.   MRN: [id number]   Jonathan Montgomery 02/05/2021, 3:35 PM

## 2021-02-05 NOTE — Transfer of Care (Signed)
Immediate Anesthesia Transfer of Care Note  Patient: Crew Goren  Procedure(s) Performed: OPEN REDUCTION INTERNAL FIXATION (ORIF) DISTAL RADIAL FRACTURE and Irrigation & debridement of open fracture (Right )  Patient Location: PACU  Anesthesia Type:General  Level of Consciousness: drowsy  Airway & Oxygen Therapy: Patient Spontanous Breathing  Post-op Assessment: Report given to RN and Post -op Vital signs reviewed and stable  Post vital signs: Reviewed and stable  Last Vitals:  Vitals Value Taken Time  BP 112/58 02/05/21 2008  Temp    Pulse 75 02/05/21 2009  Resp 16 02/05/21 2009  SpO2 100 % 02/05/21 2009  Vitals shown include unvalidated device data.  Last Pain:  Vitals:   02/05/21 1452  TempSrc:   PainSc: 7          Complications: No complications documented.

## 2021-02-05 NOTE — ED Notes (Signed)
Lactic 3.0 reported to Dr. Freida Busman

## 2021-02-05 NOTE — ED Provider Notes (Addendum)
Emergency Medicine Observation Re-evaluation Note  Jonathan Montgomery is a 30 y.o. male, seen on rounds today.  Pt initially presented to the ED for complaints of fall, Patient climbed out of a window and fell 20 feet to the ground. Had Trauma workup, only significant injury was open wrist Fx, taken to OR for repair and returned to the ED for Psych consult. Currently, the patient is awake and alert, asking for something to eat, drink.   Physical Exam  BP 137/79 (BP Location: Left Arm)   Pulse 79   Temp 98.2 F (36.8 C) (Oral)   Resp 16   Ht 5\' 11"  (1.803 m)   Wt 90.7 kg   SpO2 98%   BMI 27.89 kg/m  Physical Exam General: Awake and alert Psych: calm and cooperative R arm in cast/splint. Diminished sensation post regional block  ED Course / MDM  EKG:   I have reviewed the labs performed to date as well as medications administered while in observation.  Recent changes in the last 24 hours include see above. Ancef q8hrs pending Psych dispo.  Plan  Current plan is for Psych evaluation. Patient is not under full IVC at this time.    1:47 AM Patient evaluated by TTS, plan inpatient admission. No beds at [facility]. Continue to monitor in ED.    [facility], MD 02/06/21 [phone]

## 2021-02-05 NOTE — Op Note (Addendum)
02/05/2021 [facility] OR  Operative Note  Pre Op Diagnosis: Open right intraarticular distal radius fracture  Post Op Diagnosis: Open right intraarticular distal radius fracture  Procedure:  1. ORIF Right intraarticular distal radius fracture, 2 intraarticular fragments 2. Right brachioradialis release 3.  Irrigation debridement of open distal ulna/distal radius fracture  Surgeon: Betha Loa, MD  Assistant: none  Anesthesia: General with regional  Fluids: Per anesthesia flow sheet  EBL: minimal  Complications: None  Specimen: None  Tourniquet Time:  Total Tourniquet Time Documented: Upper Arm (Right) - 86 minutes Total: Upper Arm (Right) - 86 minutes   Disposition: Stable to PACU  INDICATIONS:  Jonathan Montgomery is a 30 y.o. male states he jumped from a window earlier today injuring his right wrist.  Was seen at the [facility] emergency department where radiographs were taken revealing a right distal radius fracture.  He had wound at the ulnar side of the wrist concerning for open fracture.  Recommended operative irrigation debridement of the possibly open fracture and open reduction internal fixation of distal radius fracture. We discussed nonoperative and operative treatment options.  He wished to proceed with operative fixation.  Risks, benefits, and alternatives of surgery were discussed including the risk of blood loss; infection; damage to nerves, vessels, tendons, ligaments, bone; failure of surgery; need for additional surgery; complications with wound healing; continued pain; nonunion; malunion; stiffness.  We also discussed the possible need for bone graft and the benefits and risks including the possibility of disease transmission.  He voiced understanding of these risks and elected to proceed.    OPERATIVE COURSE:  After being identified preoperatively by myself, the patient and I agreed upon the procedure and site of procedure.  Surgical site was marked.   Surgical consent  had been signed.  He was given IV Ancef as preoperative antibiotic prophylaxis.  He was transferred to the operating room and placed on the operating room table in supine position with the Right upper extremity on an armboard. General anesthesia was induced by the anesthesiologist.  A regional block had been performed by anesthesia in preoperative holding.  The Right upper extremity was prepped and draped in normal sterile orthopedic fashion.  A surgical pause was performed between the surgeons, anesthesia and operating room staff, and all were in agreement as to the patient, procedure and site of procedure.  Tourniquet at the proximal aspect of the extremity was inflated to 250 mmHg after exsanguination of the limb with an Esmarch bandage.  The traumatic wound at the ulnar side of the wrist was extended proximally and distally.  It was probed with a freer elevator.  It coursed down toward the ulnocarpal joint.  There was no gross contamination.  The wound was sharply debrided using the knife to remove the skin edge and subcutaneous tissue at the traumatic portion of the wound.  It was then copiously irrigated with sterile saline by bulb syringe.  1000 cc was used.  Attention was turned to the distal radius.  Standard volar Sherilyn Cooter approach was used.  The bipolar electrocautery was used to obtain hemostasis.  The superficial and deep portions of the FCR tendon sheath were incised, and the FCR and FPL were swept ulnarly to protect the palmar cutaneous branch of the median nerve.  The brachioradialis was released at the radial side of the radius.  The pronator quadratus was released and elevated with the periosteal elevator.  The fracture site was identified and cleared of soft tissue interposition and hematoma.  It  was reduced under direct visualization.  There is a fracture line coursing toward the joint.  An [employer] volar distal radial locking plate was selected.  It was secured to the bone with the guidepins.  C-arm  was used in AP and lateral projections to ensure appropriate reduction and position of the hardware and adjustments made as necessary.  Fracture line appears to go into the joint creating 2 intra-articular fragments.  Standard AO drilling and measuring technique was used.  A single screw was placed in the slotted hole in the shaft of the plate.  The distal holes were filled with locking pegs with the exception of the styloid holes, which were filled with locking screws.  The remaining holes in the shaft of the plate were filled with nonlocking screws.  Good purchase was obtained.  C-arm was used in AP, lateral and oblique projections to ensure appropriate reduction and position of hardware, which was the case.  There was no intra-articular penetration of hardware.  The wound was copiously irrigated with sterile saline.  Pronator quadratus was repaired back over top of the plate using 4-0 Vicryl suture.  Vicryl suture was placed in the subcutaneous tissues in an inverted interrupted fashion and the skin was closed with 4-0 nylon in a horizontal mattress fashion.  The wound at the ulnar side of the wrist was explored again.  The ulnar artery and nerve were identified and were intact.  The wound was then closed with 4-0 nylon in a horizontal mattress fashion.  There was good pronation and supination of the wrist without crepitance.  The wounds wire then dressed with sterile Xeroform, 4x4s, and wrapped with a Kerlix bandage.  A volar splint was placed and wrapped with Kerlix and Ace bandage.  Tourniquet was deflated at 86 minutes.  Fingertips were pink with brisk capillary refill after deflation of the tourniquet.  Operative drapes were broken down.  The patient was awoken from anesthesia safely.  He was transferred back to the stretcher and taken to the PACU in stable condition.  I will see him back in the office in one week for postoperative followup.  I will give him a prescription for Norco 5/325 1-2 tabs PO q6  hours prn pain, dispense # 15 and Bactrim DS 1 p.o. twice daily x7 days.  He is returned to the emergency department at this time for psych evaluation.    Betha Loa, MD Electronically signed, 02/05/21

## 2021-02-05 NOTE — Anesthesia Procedure Notes (Signed)
Anesthesia Regional Block: Supraclavicular block   Pre-Anesthetic Checklist: ,, timeout performed, Correct Patient, Correct Site, Correct Laterality, Correct Procedure, Correct Position, site marked, Risks and benefits discussed, pre-op evaluation,  At surgeon's request and post-op pain management  Laterality: Right  Prep: Maximum Sterile Barrier Precautions used, chloraprep       Needles:  Injection technique: Single-shot  Needle Type: Echogenic Stimulator Needle     Needle Length: 9cm  Needle Gauge: 21     Additional Needles:   Procedures:,,,, ultrasound used (permanent image in chart),,,,  Narrative:  Start time: 02/05/2021 5:26 PM End time: 02/05/2021 5:36 PM Injection made incrementally with aspirations every 5 mL. Anesthesiologist: Gaynelle Adu, MD  Additional Notes: 2% Lidocaine skin wheel.

## 2021-02-05 NOTE — Anesthesia Procedure Notes (Signed)
Procedure Name: LMA Insertion Date/Time: 02/05/2021 5:54 PM Performed by: Tressia Miners, CRNA Pre-anesthesia Checklist: Patient identified, Emergency Drugs available, Suction available and Patient being monitored Patient Re-evaluated:Patient Re-evaluated prior to induction Oxygen Delivery Method: Circle System Utilized Preoxygenation: Pre-oxygenation with 100% oxygen Induction Type: IV induction Ventilation: Mask ventilation without difficulty LMA: LMA inserted LMA Size: 4.0 Number of attempts: 1 Airway Equipment and Method: Bite block Placement Confirmation: positive ETCO2 Tube secured with: Tape Dental Injury: Teeth and Oropharynx as per pre-operative assessment

## 2021-02-06 DIAGNOSIS — F1994 Other psychoactive substance use, unspecified with psychoactive substance-induced mood disorder: Secondary | ICD-10-CM

## 2021-02-06 LAB — RAPID URINE DRUG SCREEN, HOSP PERFORMED
Amphetamines: POSITIVE — AB
Barbiturates: NOT DETECTED
Benzodiazepines: POSITIVE — AB
Cocaine: NOT DETECTED
Opiates: NOT DETECTED
Tetrahydrocannabinol: POSITIVE — AB

## 2021-02-06 LAB — URINALYSIS, ROUTINE W REFLEX MICROSCOPIC
Bilirubin Urine: NEGATIVE
Glucose, UA: 50 mg/dL — AB
Hgb urine dipstick: NEGATIVE
Ketones, ur: 20 mg/dL — AB
Leukocytes,Ua: NEGATIVE
Nitrite: NEGATIVE
Protein, ur: NEGATIVE mg/dL
Specific Gravity, Urine: 1.034 — ABNORMAL HIGH (ref 1.005–1.030)
pH: 6 (ref 5.0–8.0)

## 2021-02-06 LAB — SAMPLE TO BLOOD BANK

## 2021-02-06 MED ORDER — GABAPENTIN 300 MG PO CAPS
300.0000 mg | ORAL_CAPSULE | Freq: Three times a day (TID) | ORAL | Status: DC
  Administered 2021-02-06: 300 mg via ORAL
  Filled 2021-02-06: qty 1

## 2021-02-06 NOTE — ED Provider Notes (Signed)
On my examination, patient is resting comfortably.  He denies any headache.  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  No recent alcohol use or history of withdrawal seizures.  I spoke with Jonathan Market NP who will rescind psychiatric recommendation for inpatient hospitalization given that his family has traveled 10 hours to come pick him up and will plan to bring him to the [facility] in [place].  She was asking if he is medically cleared to travel that distance.  I will encourage patient to get out of the car every couple of hours to walk around, but otherwise do not feel as though he must stay here in the hospital.  In his disposition papers, Dr. Merlyn Montgomery has recommended outpatient follow-up in 1 week and has already put in prescriptions for Bactrim and Norco.  I encouraged him and his family to pick up those prescriptions before leaving town in to come back for his appointment next week, as planned.  He was hypokalemic on arrival, but was replenished with 60 mill equivalents potassium.  Vital signs have been stable.  He has no new complaints.  Discussed with Dr. Audley Hose and feel as though he can be cleared from medical standpoint.    I spoke with patient's brother-in-law, Jonathan Montgomery, who understands return precautions.  He states that he will call the office of Dr. Merlyn Montgomery on Monday to discuss follow-up.  He also understands the patient will need to walk around during the long drive home in effort to mitigate risk of clot formation.   Lorelee New, PA-C 02/06/21 1137    Cheryll Cockayne, MD 02/07/21 [phone]
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
## 2021-02-06 NOTE — ED Notes (Signed)
Pt discharged home per MD order. Discharge summary reviewed with pt and pt brother, who both verbalize understanding on discharge and follow up care . Pt denies SI at discharge. No s/s of acute distress noted at discharge. Pt ambulatory off unit and discharged home with family

## 2021-02-06 NOTE — Progress Notes (Signed)
Per Ene Ajibola, NP, patient meets criteria for inpatient treatment. There are no available or appropriate beds at [facility] today. CSW faxed referrals to the following facilities for review:  [facility]  TTS will continue to seek bed placement.  Anjani Feuerborn, MSW, LCSW-A, LCAS-A Phone: [phone] Disposition/TOC 

## 2021-02-06 NOTE — [facility] Suicide Risk Assessment (Cosign Needed)
Suicide Risk Assessment  Discharge Assessment   [facility] Discharge Suicide Risk Assessment   Principal Problem: Psychoactive substance-induced mood disorder [facility]) Discharge Diagnoses: Principal Problem:   Psychoactive substance-induced mood disorder (HCC)  Patient's family are in the room at the time of assessment.  Patient is apprised of privacy rights; He agrees to for this provider to discuss his medical concerns with family present.   Francene Finders, 30 y.o., male patient presented to [facility] ED for polysubstance mood disorder and self injurious behaviors. Patient seen via telepsych by this provider; chart reviewed and consulted with Dr.Kumar on 02/06/21.  On evaluation Ewen Varnell reports he does not want to hurt himself or anyone else, endorses drug use and essentially being homeless for the past few months.  Acknowledges he makes poor decisions when he uses drugs but has never jumped out of a window before.  Since staying in the hospital overnight, he has had a chance to reflect.  Now that he has cleared up, he is more coherent, logical and states he would like to return to [place] to live with his brother.  All of his family live there and can provide support and resources for him.  He does not have family in [place], was here with a girlfriend who uses drugs as well, states she is not a good support for him.  He is future oriented and verbalizes his intent to continue outpatient treatment for substance abuse and mental health.    Collateral information received from patient's family(Mackela Roomey, Corey Skains) who participate in the assessment. Apparently they traveled from [place] after learning of this unfortunate event.  They tell me they are financially strapped and had to pawn items to get here today. Their limited resources preclude additional stay in [place] as they cannot afford lodging.  His family would like to take him home today, and have plans to get him IOP care at [facility] facility near their home.  Both his sister and brother state they can keep the patient safe, and do not feel he is a safety concern.    Patient recently underwent ORIF of radius, currently wearing a cast. Will need medical clearance for travel. From a psychiatric standing, CT head demonstrates No evidence of large-territorial acute infarction. No parenchymal hemorrhage. No mass lesion. No extra-axial collection. No mass effect or midline shift. No medical contribution to current presentation. It appears patient's psychosocial stressors will be exacerbated if he stays in [place] without familial support and or resources.  Although I do think he needs continued mental health care, I also feel this can be accomplished in [place] where his family is there to support him. Patient psych cleared and released to the care of his family.  I have attempted to reach the EDP provider to further review.     HPI Per EDP 02/05/2021:  Conan Mcmanaway is a 30 y.o. male.  30 year old male presents after falling approximately 20 feet while trying to climb out of the building.  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  Denies any alcohol use.  Fell onto his left wrist he complains of severe pain to his right wrist at this time.  Also notes some left ankle pain as well 2.  Denies any distal numbness tingling to his upper or lower extremities.  No chest or abdominal discomfort.  Did strike his head and does have a laceration above the right eyebrow.  Denies any neck discomfort.  EMS called and patient placed in c-collar  and backboard and transported here   Total Time spent with patient: 30 minutes  Musculoskeletal:   Psychiatric Specialty Exam: @ROSBYAGE @  Blood pressure 139/84, pulse 87, temperature 98.2 F (36.8 C), temperature source Oral, resp. rate 17, height 5\' 11"  (1.803 m), weight 90.7 kg, SpO2 97 %.Body mass index is 27.89 kg/m.  General Appearance: Casual  Eye Contact::  Good   Speech:  Clear and Coherent and Normal Rate  Volume:  Normal  Mood:  Anxious and Dysphoric  Affect:  Appropriate and Congruent  Thought Process:  Coherent, Goal Directed and Descriptions of Associations: Intact  Orientation:  Full (Time, Place, and Person)  Thought Content:  Logical and improving since coming off effects of drugs  Suicidal Thoughts:  No  Homicidal Thoughts:  No  Memory:  Immediate;   Good Recent;   Good Remote;   Good  Judgement:  Other:  Fair, appears baseline in the setting of drug use  Insight:  Fair  Psychomotor Activity:  Normal  Concentration:  Good  Recall:  Good  Fund of Knowledge:Fair  Language: Good  Akathisia:  NA  Handed:  Right  AIMS (if indicated):     Assets:  Communication Skills Desire for Improvement Social Support  Sleep:   >6 hours  Cognition: WNL  ADL's:  Intact   Mental Status Per Nursing Assessment::   On Admission:     Demographic Factors:  Low socioeconomic status  Loss Factors: Financial problems/change in socioeconomic status  Historical Factors: Impulsivity  Risk Reduction Factors:   Sense of responsibility to family, Living with another person, especially a relative and Positive social support  Continued Clinical Symptoms:  Alcohol/Substance Abuse/Dependencies  Cognitive Features That Contribute To Risk:  None    Suicide Risk:  Mild:  Suicidal ideation of limited frequency, intensity, duration, and specificity.  There are no identifiable plans, no associated intent, mild dysphoria and related symptoms, good self-control (both objective and subjective assessment), few other risk factors, and identifiable protective factors, including available and accessible social support.   Follow-up Information    [phone], MD In 1 week.   Specialty: Orthopedic Surgery Contact information: [address] [place] [address] [place] [phone]               Plan Of Care/Follow-up recommendations:  Plan- As per  above assessment, there are no current grounds for involuntary commitment at this time.?  Patient is not currently interested in inpatient services, but expresses agreement to continue outpatient treatment - we have reviewed importance of substance abuse abstinence, potential negative impact substance abuse can have on his relationships and level of functioning, and importance of medication compliance. ?  I have spoke with PA Green, updated him of above recommendations and asked that patient be supplied with one week supply of Gabapentin until he can be seen by local provider. Per family, he will establish care with [facility] in [phone]. Follow-up with orthopod as recommended.   Disposition: No evidence of imminent risk to self or others at present.   Supportive therapy provided about ongoing stressors. Refer to IOP. Discussed crisis plan, support from social network, calling 911, coming to the Emergency Department, and calling Suicide Hotline. [facility], NP 02/06/2021, 12:05 PM

## 2021-02-06 NOTE — ED Notes (Signed)
Pt family at bedside,  Family informs this RN they have driven 10 hours to come here for pt, But also have financial constraints with staying in hotels for extended period etc. Express to this RN they do not feel [place] is a good place for him, bad relationship etc. Family requesting what POC is for patient because they hope to relocate patient back close to family. This RN reached out to psych provider. Arvilla Market NP r/t above. Arvilla Market NP on phone and communicating with family at pt bedside.

## 2021-02-06 NOTE — ED Notes (Signed)
Pt resting. Even Resp. No distress noted at this time. Will continue to monitor. 

## 2021-02-07 ENCOUNTER — Encounter (HOSPITAL_COMMUNITY): Payer: Self-pay | Admitting: Orthopedic Surgery

## 2021-02-09 ENCOUNTER — Encounter (HOSPITAL_COMMUNITY): Payer: Self-pay | Admitting: Orthopedic Surgery

## 2021-07-20 ENCOUNTER — Emergency Department (HOSPITAL_COMMUNITY)
Admission: EM | Admit: 2021-07-20 | Discharge: 2021-07-21 | Disposition: A | Discharge status: Home / Self Care | Payer: Self-pay | Attending: Emergency Medicine | Admitting: Emergency Medicine

## 2021-07-20 ENCOUNTER — Encounter (HOSPITAL_COMMUNITY): Payer: Self-pay | Admitting: Emergency Medicine

## 2021-07-20 ENCOUNTER — Emergency Department (HOSPITAL_COMMUNITY): Payer: Self-pay

## 2021-07-20 DIAGNOSIS — F600 Paranoid personality disorder: Secondary | ICD-10-CM | POA: Insufficient documentation

## 2021-07-20 DIAGNOSIS — R451 Restlessness and agitation: Secondary | ICD-10-CM | POA: Insufficient documentation

## 2021-07-20 DIAGNOSIS — R000 Tachycardia, unspecified: Secondary | ICD-10-CM | POA: Insufficient documentation

## 2021-07-20 DIAGNOSIS — F1910 Other psychoactive substance abuse, uncomplicated: Secondary | ICD-10-CM

## 2021-07-20 DIAGNOSIS — F419 Anxiety disorder, unspecified: Secondary | ICD-10-CM

## 2021-07-20 DIAGNOSIS — R4182 Altered mental status, unspecified: Secondary | ICD-10-CM | POA: Insufficient documentation

## 2021-07-20 HISTORY — DX: Attention-deficit hyperactivity disorder, unspecified type: F90.9

## 2021-07-20 HISTORY — DX: Bipolar disorder, unspecified: F31.9

## 2021-07-20 HISTORY — DX: Borderline personality disorder: F60.3

## 2021-07-20 HISTORY — DX: Gastro-esophageal reflux disease without esophagitis: K21.9

## 2021-07-20 LAB — COMPREHENSIVE METABOLIC PANEL
ALT: 38 U/L (ref 0–44)
AST: 79 U/L — ABNORMAL HIGH (ref 15–41)
Albumin: 4.8 g/dL (ref 3.5–5.0)
Alkaline Phosphatase: 81 U/L (ref 38–126)
Anion gap: 17 — ABNORMAL HIGH (ref 5–15)
BUN: 16 mg/dL (ref 6–20)
CO2: 18 mmol/L — ABNORMAL LOW (ref 22–32)
Calcium: 9.7 mg/dL (ref 8.9–10.3)
Chloride: 100 mmol/L (ref 98–111)
Creatinine, Ser: 1.34 mg/dL — ABNORMAL HIGH (ref 0.61–1.24)
GFR, Estimated: >60 mL/min (ref >60)
Glucose, Bld: 81 mg/dL (ref 70–99)
Potassium: 3.8 mmol/L (ref 3.5–5.1)
Sodium: 135 mmol/L (ref 135–145)
Total Bilirubin: 2.9 mg/dL — ABNORMAL HIGH (ref 0.3–1.2)
Total Protein: 7.4 g/dL (ref 6.5–8.1)

## 2021-07-20 LAB — URINALYSIS, ROUTINE W REFLEX MICROSCOPIC
Bilirubin Urine: NEGATIVE
Glucose, UA: NEGATIVE mg/dL
Hgb urine dipstick: NEGATIVE
Ketones, ur: 80 mg/dL — AB
Leukocytes,Ua: NEGATIVE
Nitrite: NEGATIVE
Protein, ur: NEGATIVE mg/dL
Specific Gravity, Urine: 1.015 (ref 1.005–1.030)
pH: 5 (ref 5.0–8.0)

## 2021-07-20 LAB — BASIC METABOLIC PANEL
Anion gap: 14 (ref 5–15)
BUN: 14 mg/dL (ref 6–20)
CO2: 21 mmol/L — ABNORMAL LOW (ref 22–32)
Calcium: 8.8 mg/dL — ABNORMAL LOW (ref 8.9–10.3)
Chloride: 98 mmol/L (ref 98–111)
Creatinine, Ser: 1.25 mg/dL — ABNORMAL HIGH (ref 0.61–1.24)
GFR, Estimated: >60 mL/min (ref >60)
Glucose, Bld: 76 mg/dL (ref 70–99)
Potassium: 3.2 mmol/L — ABNORMAL LOW (ref 3.5–5.1)
Sodium: 133 mmol/L — ABNORMAL LOW (ref 135–145)

## 2021-07-20 LAB — CBC WITH DIFFERENTIAL/PLATELET
Abs Immature Granulocytes: 0.03 10*3/uL (ref 0.00–0.07)
Basophils Absolute: 0.1 10*3/uL (ref 0.0–0.1)
Basophils Relative: 1 %
Eosinophils Absolute: 0 10*3/uL (ref 0.0–0.5)
Eosinophils Relative: 0 %
HCT: 47.2 % (ref 39.0–52.0)
Hemoglobin: 15.2 g/dL (ref 13.0–17.0)
Immature Granulocytes: 0 %
Lymphocytes Relative: 23 %
Lymphs Abs: 2.5 10*3/uL (ref 0.7–4.0)
MCH: 27.4 pg (ref 26.0–34.0)
MCHC: 32.2 g/dL (ref 30.0–36.0)
MCV: 85 fL (ref 80.0–100.0)
Monocytes Absolute: 0.9 10*3/uL (ref 0.1–1.0)
Monocytes Relative: 8 %
Neutro Abs: 7.4 10*3/uL (ref 1.7–7.7)
Neutrophils Relative %: 68 %
Platelets: 268 10*3/uL (ref 150–400)
RBC: 5.55 MIL/uL (ref 4.22–5.81)
RDW: 14.6 % (ref 11.5–15.5)
WBC: 10.9 10*3/uL — ABNORMAL HIGH (ref 4.0–10.5)
nRBC: 0 % (ref 0.0–0.2)

## 2021-07-20 LAB — CBG MONITORING, ED
Glucose-Capillary: 94 mg/dL (ref 70–99)
Glucose-Capillary: 76 mg/dL (ref 70–99)

## 2021-07-20 LAB — RAPID URINE DRUG SCREEN, HOSP PERFORMED
Amphetamines: POSITIVE — AB
Barbiturates: NOT DETECTED
Benzodiazepines: POSITIVE — AB
Cocaine: NOT DETECTED
Opiates: NOT DETECTED
Tetrahydrocannabinol: POSITIVE — AB

## 2021-07-20 LAB — LACTIC ACID, PLASMA: Lactic Acid, Venous: 1 mmol/L (ref 0.5–1.9)

## 2021-07-20 LAB — ETHANOL: Alcohol, Ethyl (B): <10 mg/dL (ref <10)

## 2021-07-20 MED ORDER — LORAZEPAM 2 MG/ML IJ SOLN
1.0000 mg | Freq: Once | INTRAMUSCULAR | Status: AC
  Administered 2021-07-20: 1 mg via INTRAVENOUS
  Filled 2021-07-20: qty 1

## 2021-07-20 MED ORDER — SODIUM CHLORIDE 0.9 % IV BOLUS
1000.0000 mL | Freq: Once | INTRAVENOUS | Status: AC
Start: 2021-07-20 — End: 2021-07-20
  Administered 2021-07-20: 1000 mL via INTRAVENOUS

## 2021-07-20 MED ORDER — HALOPERIDOL LACTATE 5 MG/ML IJ SOLN
5.0000 mg | Freq: Once | INTRAMUSCULAR | Status: AC
  Administered 2021-07-20: 5 mg via INTRAMUSCULAR
  Filled 2021-07-20: qty 1

## 2021-07-20 MED ORDER — DIPHENHYDRAMINE HCL 50 MG/ML IJ SOLN
25.0000 mg | Freq: Once | INTRAMUSCULAR | Status: AC
  Administered 2021-07-20: 25 mg via INTRAVENOUS
  Filled 2021-07-20: qty 1

## 2021-07-20 MED ORDER — SODIUM CHLORIDE 0.9 % IV BOLUS
1000.0000 mL | Freq: Once | INTRAVENOUS | Status: AC
  Administered 2021-07-20: 1000 mL via INTRAVENOUS

## 2021-07-20 MED ORDER — LACTATED RINGERS IV BOLUS: 500.0000 mL | Freq: Once | INTRAVENOUS | Status: DC

## 2021-07-20 NOTE — ED Notes (Signed)
Labs drawn, l.green, lav. D.green, red tibe sent to lab.
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
## 2021-07-20 NOTE — Discharge Instructions (Addendum)
Information is attached below for additional assistance with substance abuse if you would like them.  I have also given information for psychiatric evaluation outpatient if you feel like that would be helpful.  If things change or worsen return back to the ED as needed.  There is help if you need it.  Please do not use dirty needles, this could cause you a severe infection to your skin, heart or spinal cord.  This could kill you or leave you permanently disabled.  There was a recent study done at [facility] that showed that the risk of death for someone that had unintentionally overdosed on narcotics was as high as 15% in the next year.  This is much higher than most every other medical condition.  [facility] Solution to the Opioid Problem (GCSTOP) Fixed; mobile; peer-based Chase Holleman [phone] [email or web address] Fixed site exchange at [facility], Wednesdays (2-5pm) and Thursdays (4-8pm). [address] [place], [place] [address] Call or text to arrange mobile and peer exchange, Mondays (1-4pm) and Fridays (4-7pm). [facility] [email or web address]  Suboxone clinic: [facility] [address] [place], [address]  [facility] [address] [place], [address]  [facility] [address] [place] [address]
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
## 2021-07-20 NOTE — ED Provider Notes (Signed)
Assumed care of patient at shift change from previous team, please see their note for full H&P.  Briefly patient is currently awaiting metabolization of his drugs, ambulation, and discharge.    1658: Patient is reevaluated.  He awakens to his name, is able to say a few words before falling back asleep.  I placed his pulse oximeter back on, he is 96% on room air.  He moves all 4 extremities spontaneously.  1800: Patient remains unchanged.  He is resting comfortably in no distress.  1858.  Patient awakens more readily now.  He tells me he is hungry and then falls back asleep.  2019: Patient awakens readily to his name.  He states he needs to urinate, and is attempting to use a urinal.  Encouraged him to wake up so that he can eat.\  2218 patient is much more awake.  He asked questions about his UDS. He is eating chips and drinking Sprite, will feed, RN is aware of need to ambulate patient.  2330: Patient reevaluated.  He is given a sandwich.  He sat up very quickly and felt dizzy.  Encouraged to eat, drink his Sprite.  At shift change care was transferred to [facility] who will follow pending studies, re-evaulate and determine disposition.         Cristina Gong, PA-C 07/21/21 0015    Virgina Norfolk, DO 07/21/21 2349

## 2021-07-21 ENCOUNTER — Emergency Department (HOSPITAL_COMMUNITY)
Admission: EM | Admit: 2021-07-21 | Discharge: 2021-07-23 | Disposition: A | Discharge status: Home / Self Care | Payer: Self-pay | Attending: Emergency Medicine | Admitting: Emergency Medicine

## 2021-07-21 ENCOUNTER — Encounter (HOSPITAL_COMMUNITY): Payer: Self-pay

## 2021-07-21 ENCOUNTER — Other Ambulatory Visit: Payer: Self-pay

## 2021-07-21 DIAGNOSIS — F1190 Opioid use, unspecified, uncomplicated: Secondary | ICD-10-CM | POA: Insufficient documentation

## 2021-07-21 DIAGNOSIS — F1520 Other stimulant dependence, uncomplicated: Secondary | ICD-10-CM | POA: Insufficient documentation

## 2021-07-21 DIAGNOSIS — R45851 Suicidal ideations: Secondary | ICD-10-CM | POA: Insufficient documentation

## 2021-07-21 DIAGNOSIS — Z20822 Contact with and (suspected) exposure to covid-19: Secondary | ICD-10-CM | POA: Insufficient documentation

## 2021-07-21 DIAGNOSIS — F319 Bipolar disorder, unspecified: Secondary | ICD-10-CM | POA: Insufficient documentation

## 2021-07-21 LAB — COMPREHENSIVE METABOLIC PANEL
ALT: 43 U/L (ref 0–44)
AST: 72 U/L — ABNORMAL HIGH (ref 15–41)
Albumin: 4.1 g/dL (ref 3.5–5.0)
Alkaline Phosphatase: 73 U/L (ref 38–126)
Anion gap: 9 (ref 5–15)
BUN: 8 mg/dL (ref 6–20)
CO2: 28 mmol/L (ref 22–32)
Calcium: 9.3 mg/dL (ref 8.9–10.3)
Chloride: 100 mmol/L (ref 98–111)
Creatinine, Ser: 1.11 mg/dL (ref 0.61–1.24)
GFR, Estimated: >60 mL/min (ref >60)
Glucose, Bld: 98 mg/dL (ref 70–99)
Potassium: 3.8 mmol/L (ref 3.5–5.1)
Sodium: 137 mmol/L (ref 135–145)
Total Bilirubin: 1.5 mg/dL — ABNORMAL HIGH (ref 0.3–1.2)
Total Protein: 6.9 g/dL (ref 6.5–8.1)

## 2021-07-21 LAB — CBC
HCT: 45.5 % (ref 39.0–52.0)
Hemoglobin: 14.4 g/dL (ref 13.0–17.0)
MCH: 27.1 pg (ref 26.0–34.0)
MCHC: 31.6 g/dL (ref 30.0–36.0)
MCV: 85.7 fL (ref 80.0–100.0)
Platelets: 247 10*3/uL (ref 150–400)
RBC: 5.31 MIL/uL (ref 4.22–5.81)
RDW: 14.3 % (ref 11.5–15.5)
WBC: 7.1 10*3/uL (ref 4.0–10.5)
nRBC: 0 % (ref 0.0–0.2)

## 2021-07-21 LAB — RAPID URINE DRUG SCREEN, HOSP PERFORMED
Amphetamines: POSITIVE — AB
Barbiturates: NOT DETECTED
Benzodiazepines: POSITIVE — AB
Cocaine: NOT DETECTED
Opiates: NOT DETECTED
Tetrahydrocannabinol: POSITIVE — AB

## 2021-07-21 LAB — ETHANOL: Alcohol, Ethyl (B): <10 mg/dL (ref <10)

## 2021-07-21 NOTE — ED Notes (Signed)
Pt walked independently from stretcher to nurses desk and back.

## 2021-07-21 NOTE — ED Notes (Signed)
[name], [phone] or [phone] would like an update when available

## 2021-07-21 NOTE — ED Notes (Signed)
Pt cleared for discharge by PA. Discharge instructions discussed with pt. Pt verbalized understanding with no questions at this time.

## 2021-07-21 NOTE — ED Notes (Signed)
Pt is alert. Was ambulated in the hallway initially unsteady but regain stability and was able to ambulate independently. Pt is sitting up and eating ED sandwich bag. PA updated.

## 2021-07-21 NOTE — ED Triage Notes (Signed)
Pt in for wanting to hurt self. Patient stating that he wants to hurt himself. Pt states that he doesn't care anymore what happens to him. Pt states that he's contemplated cutting his wrist and jumping from a roof. Pt admitted to recent drug use methamphetamine and heroin, but does not recall the past 24 hours. Pt states he ran out of medications and that's why he went into a binder. Pt has empty bottles of fluoxetine, trazadone and lithium in possession.
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
## 2021-07-21 NOTE — ED Provider Notes (Signed)
Emergency Medicine Provider Triage Evaluation Note  Ramzy Cappelletti , a 30 y.o. male  was evaluated in triage.  Pt complains of suicidal ideations.  Denies any homicidal ideations.  Was recently discharged after psychiatric stay.  No medical complaints.  Review of Systems  Positive:  Negative: See above   Physical Exam  BP 120/82 (BP Location: Right Arm)   Pulse 97   Temp 98.3 F (36.8 C)   Resp 16   Ht 5\' 11"  (1.803 m)   Wt 90.7 kg   SpO2 99%   BMI 27.89 kg/m  Gen:   Awake, no distress   Resp:  Normal effort  MSK:   Moves extremities without difficulty  Other:    Medical Decision Making  Medically screening exam initiated at 9:08 PM.  Appropriate orders placed.  Thayer Embleton was informed that the remainder of the evaluation will be completed by another provider, this initial triage assessment does not replace that evaluation, and the importance of remaining in the ED until their evaluation is complete.     Francene Finders 07/21/21 2111    2112, MD 07/21/21 2226

## 2021-07-22 NOTE — ED Notes (Signed)
Patient was discharged yesterday at ~ 0330, checked back in at 2030 c/o suicidal idealogy. Patient continues to claim suicidal but had no definite plans, state "however I figure out to do it",. Patient sleeping , no needs discussed.

## 2021-07-22 NOTE — ED Notes (Signed)
Ambulatory to the restroom without assistance

## 2021-07-22 NOTE — ED Provider Notes (Signed)
Nursing staff asked me to reevaluate patient as it appears that he never had a TTS order placed after being seen at 4 AM this morning.  I have discussed with patient.  He continues to have suicidal ideation.  We will place consulted TTS at this time.  He is medically cleared.   Tanda Rockers, PA-C 07/22/21 1752    Virgina Norfolk, DO 07/22/21 2034
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
## 2021-07-22 NOTE — ED Provider Notes (Signed)
[facility] EMERGENCY DEPARTMENT Provider Note   CSN: [id number] Arrival date & time: 07/21/21  1915     History Chief Complaint  Patient presents with   Suicidal    Jonathan Montgomery is a 30 y.o. male.  HPI 30 year old male with a history of bipolar 1 disorder, borderline personality disorder, ADHD, GERD presents to the ER with suicidal ideations.  He states that he does not care about what happens to him, he has contemplated cutting his wrist or jumping from a roof.  Admits to recent methamphetamine and heroin use but none in the last 24 hours.  States he ran out of his psychiatric medicines which is why he went down on a "bender".  He brought bottles of his fluoxetine, trazodone and lithium.  Denies homicidal ideations, hallucinations. No other physical complaints at this time     Past Medical History:  Diagnosis Date   ADHD    Bipolar 1 disorder (HCC)    Borderline personality disorder (HCC)    GERD (gastroesophageal reflux disease)     Patient Active Problem List   Diagnosis Date Noted   Psychoactive substance-induced mood disorder (HCC) 02/06/2021   Substance induced mood disorder (HCC) 01/27/2021    Past Surgical History:  Procedure Laterality Date   OPEN REDUCTION INTERNAL FIXATION (ORIF) DISTAL RADIAL FRACTURE Right 02/05/2021   Procedure: OPEN REDUCTION INTERNAL FIXATION (ORIF) DISTAL RADIAL FRACTURE and Irrigation & debridement of open fracture;  Surgeon: Betha Loa, MD;  Location: [facility] OR;  Service: Orthopedics;  Laterality: Right;       No family history on file.  Social History   Tobacco Use   Smoking status: Never   Smokeless tobacco: Never    Home Medications Prior to Admission medications   Medication Sig Start Date End Date Taking? Authorizing Provider  HYDROcodone-acetaminophen (NORCO) 5-325 MG tablet 1-2 tabs po q6 hours prn pain 02/05/21   Betha Loa, MD  sulfamethoxazole-trimethoprim (BACTRIM DS) 800-160 MG tablet Take 1  tablet by mouth 2 (two) times daily. 02/05/21   Betha Loa, MD    Allergies    Patient has no known allergies.  Review of Systems   Review of Systems Ten systems reviewed and are negative for acute change, except as noted in the HPI.   Physical Exam Updated Vital Signs BP 120/82 (BP Location: Right Arm)   Pulse 97   Temp 98.3 F (36.8 C)   Resp 16   Ht 5\' 11"  (1.803 m)   Wt 90.7 kg   SpO2 99%   BMI 27.89 kg/m   Physical Exam Vitals reviewed.  Constitutional:      Appearance: Normal appearance.  HENT:     Head: Normocephalic and atraumatic.  Eyes:     General:        Right eye: No discharge.        Left eye: No discharge.     Extraocular Movements: Extraocular movements intact.     Conjunctiva/sclera: Conjunctivae normal.  Musculoskeletal:        General: No swelling. Normal range of motion.  Neurological:     General: No focal deficit present.     Mental Status: He is alert and oriented to person, place, and time.  Psychiatric:        Mood and Affect: Mood normal.        Behavior: Behavior normal.    ED Results / Procedures / Treatments   Labs (all labs ordered are listed, but only abnormal results are displayed) Labs  Reviewed  COMPREHENSIVE METABOLIC PANEL - Abnormal; Notable for the following components:      Result Value   AST 72 (*)    Total Bilirubin 1.5 (*)    All other components within normal limits  RAPID URINE DRUG SCREEN, HOSP PERFORMED - Abnormal; Notable for the following components:   Benzodiazepines POSITIVE (*)    Amphetamines POSITIVE (*)    Tetrahydrocannabinol POSITIVE (*)    All other components within normal limits  ETHANOL  CBC    EKG None  Radiology CT Head Wo Contrast  Result Date: 07/20/2021 CLINICAL DATA:  Facial trauma EXAM: CT HEAD WITHOUT CONTRAST CT MAXILLOFACIAL WITHOUT CONTRAST TECHNIQUE: Multidetector CT imaging of the head and maxillofacial structures were performed using the standard protocol without intravenous  contrast. Multiplanar CT image reconstructions of the maxillofacial structures were also generated. COMPARISON:  02/05/2021 FINDINGS: CT HEAD FINDINGS Brain: No evidence of acute infarction, hemorrhage, hydrocephalus, extra-axial collection or mass lesion/mass effect. Vascular: No hyperdense vessel or unexpected calcification. Skull: Normal. Negative for fracture or focal lesion. Other: None. CT MAXILLOFACIAL FINDINGS Osseous: Redemonstrated subacute bilateral nasal bone fractures with slight leftward deviation. No additional maxillofacial bone fracture. Bony orbital walls are intact. Mandible intact. Temporomandibular joints are aligned without dislocation. Orbits: Negative. No traumatic or inflammatory finding. Sinuses: Mucous retention cysts or small polyps within the bilateral maxillary sinuses, unchanged. Paranasal sinuses and mastoid air cells are otherwise clear. Soft tissues: Negative. IMPRESSION: 1. No acute intracranial findings. 2. Redemonstrated subacute bilateral nasal bone fractures. 3. No new maxillofacial bone fracture. Electronically Signed   By: Duanne Guess D.O.   On: 07/20/2021 12:27   CT Maxillofacial WO CM  Result Date: 07/20/2021 CLINICAL DATA:  Facial trauma EXAM: CT HEAD WITHOUT CONTRAST CT MAXILLOFACIAL WITHOUT CONTRAST TECHNIQUE: Multidetector CT imaging of the head and maxillofacial structures were performed using the standard protocol without intravenous contrast. Multiplanar CT image reconstructions of the maxillofacial structures were also generated. COMPARISON:  02/05/2021 FINDINGS: CT HEAD FINDINGS Brain: No evidence of acute infarction, hemorrhage, hydrocephalus, extra-axial collection or mass lesion/mass effect. Vascular: No hyperdense vessel or unexpected calcification. Skull: Normal. Negative for fracture or focal lesion. Other: None. CT MAXILLOFACIAL FINDINGS Osseous: Redemonstrated subacute bilateral nasal bone fractures with slight leftward deviation. No additional  maxillofacial bone fracture. Bony orbital walls are intact. Mandible intact. Temporomandibular joints are aligned without dislocation. Orbits: Negative. No traumatic or inflammatory finding. Sinuses: Mucous retention cysts or small polyps within the bilateral maxillary sinuses, unchanged. Paranasal sinuses and mastoid air cells are otherwise clear. Soft tissues: Negative. IMPRESSION: 1. No acute intracranial findings. 2. Redemonstrated subacute bilateral nasal bone fractures. 3. No new maxillofacial bone fracture. Electronically Signed   By: Duanne Guess D.O.   On: 07/20/2021 12:27    Procedures Procedures   Medications Ordered in ED Medications - No data to display  ED Course  I have reviewed the triage vital signs and the nursing notes.  Pertinent labs & imaging results that were available during my care of the patient were reviewed by me and considered in my medical decision making (see chart for details).    MDM Rules/Calculators/A&P                           Patient presents to the ER with complaints of suicidal ideations , requiring medical current clearance for evaluation by psychiatry.  On presentation, the patient is calm, cooperative.  Vitals personally reviewed by me, overall reassuring.  I personally  reviewed his lab work, which did not show any significant abnormalities.  CBC without leukocytosis, normal hemoglobin.  CMP without electrolyte abnormalities, normal BUN/creatinine. Mild AST elevation, no abdominal pain.   Negative ethanol.  UDS positive for benzos, amphetamines, THC.  He has been medically cleared for further evaluation by TTS.  Dispo according to the recommendation.  Final Clinical Impression(s) / ED Diagnoses Final diagnoses:  Suicidal ideation    Rx / DC Orders ED Discharge Orders     None        Mare Ferrari, PA-C 07/22/21 0410    Nira Conn, MD 07/22/21 [phone]

## 2021-07-23 ENCOUNTER — Other Ambulatory Visit (HOSPITAL_COMMUNITY)
Admission: EM | Admit: 2021-07-23 | Discharge: 2021-07-26 | Disposition: A | Discharge status: Home / Self Care | Payer: No Payment, Other | Attending: Psychiatry | Admitting: Psychiatry

## 2021-07-23 ENCOUNTER — Other Ambulatory Visit: Payer: Self-pay

## 2021-07-23 DIAGNOSIS — F1994 Other psychoactive substance use, unspecified with psychoactive substance-induced mood disorder: Secondary | ICD-10-CM | POA: Diagnosis present

## 2021-07-23 DIAGNOSIS — Z79899 Other long term (current) drug therapy: Secondary | ICD-10-CM | POA: Insufficient documentation

## 2021-07-23 LAB — RESP PANEL BY RT-PCR (FLU A&B, COVID) ARPGX2
Influenza A by PCR: NEGATIVE
Influenza B by PCR: NEGATIVE
SARS Coronavirus 2 by RT PCR: NEGATIVE

## 2021-07-23 LAB — HEMOGLOBIN A1C
Hgb A1c MFr Bld: 5.5 % (ref 4.8–5.6)
Mean Plasma Glucose: 111.15 mg/dL

## 2021-07-23 LAB — LIPID PANEL
Cholesterol: 142 mg/dL (ref 0–200)
HDL: 53 mg/dL (ref >40)
LDL Cholesterol: 62 mg/dL (ref 0–99)
Total CHOL/HDL Ratio: 2.7 RATIO
Triglycerides: 135 mg/dL (ref <150)
VLDL: 27 mg/dL (ref 0–40)

## 2021-07-23 LAB — LITHIUM LEVEL: Lithium Lvl: <0.06 mmol/L — ABNORMAL LOW (ref 0.60–1.20)

## 2021-07-23 LAB — TSH: TSH: 4.007 u[IU]/mL (ref 0.350–4.500)

## 2021-07-23 MED ORDER — MAGNESIUM HYDROXIDE 400 MG/5ML PO SUSP: 30.0000 mL | Freq: Every day | ORAL | Status: DC | PRN

## 2021-07-23 MED ORDER — LITHIUM CARBONATE 150 MG PO CAPS
150.0000 mg | ORAL_CAPSULE | Freq: Two times a day (BID) | ORAL | Status: DC
  Administered 2021-07-24 – 2021-07-26 (×5): 150 mg via ORAL
  Filled 2021-07-23 (×7): qty 1

## 2021-07-23 MED ORDER — ACETAMINOPHEN 325 MG PO TABS: 650.0000 mg | ORAL_TABLET | Freq: Four times a day (QID) | ORAL | Status: DC | PRN

## 2021-07-23 MED ORDER — LORAZEPAM 1 MG PO TABS: 1.0000 mg | ORAL_TABLET | Freq: Four times a day (QID) | ORAL | Status: DC | PRN

## 2021-07-23 MED ORDER — FLUOXETINE HCL 20 MG PO CAPS
20.0000 mg | ORAL_CAPSULE | Freq: Every day | ORAL | Status: DC
  Administered 2021-07-24 – 2021-07-26 (×3): 20 mg via ORAL
  Filled 2021-07-23 (×5): qty 1

## 2021-07-23 MED ORDER — LOPERAMIDE HCL 2 MG PO CAPS: 2.0000 mg | ORAL_CAPSULE | ORAL | Status: DC | PRN

## 2021-07-23 MED ORDER — THIAMINE HCL 100 MG PO TABS
100.0000 mg | ORAL_TABLET | Freq: Every day | ORAL | Status: DC
  Administered 2021-07-24 – 2021-07-26 (×3): 100 mg via ORAL
  Filled 2021-07-23 (×4): qty 1

## 2021-07-23 MED ORDER — ONDANSETRON 4 MG PO TBDP
4.0000 mg | ORAL_TABLET | Freq: Four times a day (QID) | ORAL | Status: DC | PRN
Start: 2021-07-23 — End: 2021-07-26

## 2021-07-23 MED ORDER — HYDROXYZINE HCL 25 MG PO TABS
25.0000 mg | ORAL_TABLET | Freq: Three times a day (TID) | ORAL | Status: DC | PRN
  Administered 2021-07-24 – 2021-07-25 (×2): 25 mg via ORAL
  Filled 2021-07-23 (×2): qty 1

## 2021-07-23 MED ORDER — ALUM & MAG HYDROXIDE-SIMETH 200-200-20 MG/5ML PO SUSP: 30.0000 mL | ORAL | Status: DC | PRN

## 2021-07-23 MED ORDER — TRAZODONE HCL 50 MG PO TABS
50.0000 mg | ORAL_TABLET | Freq: Every evening | ORAL | Status: DC | PRN
  Administered 2021-07-23 – 2021-07-25 (×3): 50 mg via ORAL
  Filled 2021-07-23 (×3): qty 1

## 2021-07-23 MED ORDER — ADULT MULTIVITAMIN W/MINERALS CH
1.0000 | ORAL_TABLET | Freq: Every day | ORAL | Status: DC
  Administered 2021-07-23 – 2021-07-26 (×4): 1 via ORAL
  Filled 2021-07-23 (×5): qty 1

## 2021-07-23 MED ORDER — CLONIDINE HCL 0.1 MG PO TABS: 0.1000 mg | ORAL_TABLET | Freq: Four times a day (QID) | ORAL | Status: DC | PRN

## 2021-07-23 NOTE — ED Provider Notes (Signed)
Emergency Medicine Observation Re-evaluation Note  Jonathan Montgomery is a 30 y.o. male, seen on rounds today.  Pt initially presented to the ED for complaints of Suicidal Currently, the patient is waiting for transfer to [facility].  Physical Exam  BP 112/73 (BP Location: Right Arm)   Pulse 71   Temp 98 F (36.7 C) (Oral)   Resp 20   Ht 1.803 m (5\' 11" )   Wt 90.7 kg   SpO2 98%   BMI 27.89 kg/m  Physical Exam General: no acute distress Cardiac: regular rate Lungs: no stridor Psych: calm mood, cooperative  ED Course / MDM  EKG:   I have reviewed the labs performed to date as well as medications administered while in observation.  Recent changes in the last 24 hours include no acute changes.  TTS completed.  Pt has remained stable..  Plan  Current plan is for  [facility] program.  Waiting for transportation  Jonathan Montgomery is not under involuntary commitment.     Francene Finders, MD 07/23/21 [phone]

## 2021-07-23 NOTE — Group Note (Signed)
Group Topic: Social Support  Group Date: 07/23/2021 Start Time: 1930 End Time: 2000 Facilitators: Armandina Stammer, [place]  Department: [facility]  Number of Participants: 4  Group Focus: check in Treatment Modality:  Cognitive Behavioral Therapy Interventions utilized were exploration, story telling, and support Purpose: enhance coping skills, express feelings, and improve communication skills  Name: Jospeh Mangel Date of Birth: 02-20-1991  MR: [id number]    Level of Participation: minimal Quality of Participation: distracting to others and immature Interactions with others: n/a Mood/Affect: restless Triggers (if applicable): n/a Cognition: not focused Progress: Minimal Response: n/a Plan: follow-up needed  Pt states that his plan is to restart medications to get better  Patients Problems:  Patient Active Problem List   Diagnosis Date Noted   Psychoactive substance-induced mood disorder (HCC) 02/06/2021   Substance induced mood disorder (HCC) 01/27/2021

## 2021-07-23 NOTE — ED Notes (Signed)
Pt received breakfast tray and ate 100%

## 2021-07-23 NOTE — ED Provider Notes (Signed)
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 DETECTED  NONE DETECTED Final   Cocaine 02/06/2021 NONE DETECTED  NONE DETECTED Final   Benzodiazepines 02/06/2021 POSITIVE (A) NONE DETECTED Final   Amphetamines 02/06/2021 POSITIVE (A) NONE DETECTED Final   Tetrahydrocannabinol 02/06/2021 POSITIVE (A)  NONE DETECTED Final   Barbiturates 02/06/2021 NONE DETECTED  NONE DETECTED Final   Comment: (NOTE) DRUG SCREEN FOR MEDICAL PURPOSES ONLY.  IF CONFIRMATION IS NEEDED FOR ANY PURPOSE, NOTIFY LAB WITHIN 5 DAYS.  LOWEST DETECTABLE LIMITS FOR URINE DRUG SCREEN Drug Class                     Cutoff (ng/mL) Amphetamine and metabolites    1000 Barbiturate and metabolites    200 Benzodiazepine                 200 Tricyclics and metabolites     300 Opiates and metabolites        300 Cocaine and metabolites        300 THC                            50 Performed at [facility], [address], [place], [place] [address]   Admission on 01/26/2021, Discharged on 01/27/2021  Component Date Value Ref Range Status   Sodium 01/26/2021 137  135 - 145 mmol/L Final   Potassium 01/26/2021 3.6  3.5 - 5.1 mmol/L Final   Chloride 01/26/2021 100  98 - 111 mmol/L Final   CO2 01/26/2021 25  22 - 32 mmol/L Final   Glucose, Bld 01/26/2021 109 (A) 70 - 99 mg/dL Final   Glucose reference range applies only to samples taken after fasting for at least 8 hours.   BUN 01/26/2021 17  6 - 20 mg/dL Final   Creatinine, Ser 01/26/2021 1.54 (A) 0.61 - 1.24 mg/dL Final   Calcium 45/40/9811 9.9  8.9 - 10.3 mg/dL Final   Total Protein 91/47/8295 7.6  6.5 - 8.1 g/dL Final   Albumin 62/13/0865 4.7  3.5 - 5.0 g/dL Final   AST 78/46/9629 162 (A) 15 - 41 U/L Final   ALT 01/26/2021 89 (A) 0 - 44 U/L Final   Alkaline Phosphatase 01/26/2021 77  38 - 126 U/L Final   Total Bilirubin 01/26/2021 1.8 (A) 0.3 - 1.2 mg/dL Final   GFR, Estimated 01/26/2021 >60  >60 mL/min Final   Comment: (NOTE) Calculated using the CKD-EPI Creatinine Equation (2021)    Anion gap 01/26/2021 12  5 - 15 Final   Performed at [facility], [address], [place], [place] [address]   Alcohol, Ethyl (B) 01/26/2021 <10  <10 mg/dL Final   Comment: (NOTE) Lowest detectable limit for serum alcohol is 10 mg/dL.  For medical purposes  only. Performed at [facility], [address] [place], [place] [address]    WBC 01/26/2021 10.8 (A) 4.0 - 10.5 K/uL Final   RBC 01/26/2021 5.14  4.22 - 5.81 MIL/uL Final   Hemoglobin 01/26/2021 14.7  13.0 - 17.0 g/dL Final   HCT 08/19/2535 44.4  39.0 - 52.0 % Final   MCV 01/26/2021 86.4  80.0 - 100.0 fL Final   MCH 01/26/2021 28.6  26.0 - 34.0 pg Final   MCHC 01/26/2021 33.1  30.0 - 36.0 g/dL Final   RDW 64/40/3474 13.3  11.5 - 15.5 % Final   Platelets 01/26/2021 332  150 - 400 K/uL Final   nRBC 01/26/2021 0.0  0.0 - 0.2 % Final   Performed at [facility], [address] [place], [place] [address]   Opiates 01/26/2021 NONE DETECTED  NONE DETECTED Final   Cocaine 01/26/2021 POSITIVE (A) NONE DETECTED Final   Benzodiazepines 01/26/2021 NONE DETECTED  NONE DETECTED Final  Amphetamines 01/26/2021 POSITIVE (A) NONE DETECTED Final   Tetrahydrocannabinol 01/26/2021 POSITIVE (A) NONE DETECTED Final   Barbiturates 01/26/2021 NONE DETECTED  NONE DETECTED Final   Comment: (NOTE) DRUG SCREEN FOR MEDICAL PURPOSES ONLY.  IF CONFIRMATION IS NEEDED FOR ANY PURPOSE, NOTIFY LAB WITHIN 5 DAYS.  LOWEST DETECTABLE LIMITS FOR URINE DRUG SCREEN Drug Class                     Cutoff (ng/mL) Amphetamine and metabolites    1000 Barbiturate and metabolites    200 Benzodiazepine                 200 Tricyclics and metabolites     300 Opiates and metabolites        300 Cocaine and metabolites        300 THC                            50 Performed at [facility], [address] [place], [place] [address]    SARS Coronavirus 2 by RT PCR 01/26/2021 NEGATIVE  NEGATIVE Final   Comment: (NOTE) SARS-CoV-2 target nucleic acids are NOT DETECTED.  The SARS-CoV-2 RNA is generally detectable in upper respiratory specimens during the acute phase of infection. The lowest concentration of SARS-CoV-2 viral copies this assay can detect is 138 copies/mL. A negative result does not preclude  SARS-Cov-2 infection and should not be used as the sole basis for treatment or other patient management decisions. A negative result may occur with  improper specimen collection/handling, submission of specimen other than nasopharyngeal swab, presence of viral mutation(s) within the areas targeted by this assay, and inadequate number of viral copies(<138 copies/mL). A negative result must be combined with clinical observations, patient history, and epidemiological information. The expected result is Negative.  Fact Sheet for Patients:  BloggerCourse.com  Fact Sheet for Healthcare Providers:  SeriousBroker.it  This test is no                          t yet approved or cleared by the [place] FDA and  has been authorized for detection and/or diagnosis of SARS-CoV-2 by FDA under an Emergency Use Authorization (EUA). This EUA will remain  in effect (meaning this test can be used) for the duration of the COVID-19 declaration under Section 564(b)(1) of the Act, 21 U.S.C.section 360bbb-3(b)(1), unless the authorization is terminated  or revoked sooner.       Influenza A by PCR 01/26/2021 NEGATIVE  NEGATIVE Final   Influenza B by PCR 01/26/2021 NEGATIVE  NEGATIVE Final   Comment: (NOTE) The Xpert Xpress SARS-CoV-2/FLU/RSV plus assay is intended as an aid in the diagnosis of influenza from Nasopharyngeal swab specimens and should not be used as a sole basis for treatment. Nasal washings and aspirates are unacceptable for Xpert Xpress SARS-CoV-2/FLU/RSV testing.  Fact Sheet for Patients: BloggerCourse.com  Fact Sheet for Healthcare Providers: SeriousBroker.it  This test is not yet approved or cleared by the [place] FDA and has been authorized for detection and/or diagnosis of SARS-CoV-2 by FDA under an Emergency Use Authorization (EUA). This EUA will remain in effect  (meaning this test can be used) for the duration of the COVID-19 declaration under Section 564(b)(1) of the Act, 21 U.S.C. section 360bbb-3(b)(1), unless the authorization is terminated or revoked.  Performed at [facility], [address], [place], [place] [address]  Allergies: Patient has no known allergies.   Medical Decision Making  Patient admitted to the [facility] for mood stabilization and safety.   Lab Orders         Hemoglobin A1c         Lipid panel         TSH         Lithium level     Medications:   [START ON 07/24/2021] FLUoxetine  20 mg Oral Daily   [START ON 07/24/2021] lithium carbonate  150 mg Oral BID WC   multivitamin with minerals  1 tablet Oral Daily   [START ON 07/24/2021] thiamine  100 mg Oral Daily   Ativan 1 mg po q 6 hours PRN for alcohol/benzo withdrawal  Clonidine 0.1 mg q 6 hours PRN for opiate withdrawal   Cows scale ordered to assess opiate withdrawal  Ciwa scale ordered assess benzo's withdrawal   Recommendations  Based on my evaluation the patient does not appear to have an emergency medical condition.  Layla Barter, NP 07/23/21  6:26 PM
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
## 2021-07-23 NOTE — ED Notes (Signed)
Pt showering at this time

## 2021-07-23 NOTE — ED Notes (Signed)
Attempted to call [facility], no answer

## 2021-07-23 NOTE — ED Notes (Signed)
Consent for transfer and consent for treatment signed and in medical records drawer to be scanned in.

## 2021-07-23 NOTE — ED Notes (Signed)
Patient denies SI, HI and AVH. No sxs of distress

## 2021-07-23 NOTE — ED Notes (Signed)
Spoke with [facility] nurse. Waiting for a call back to give report

## 2021-07-23 NOTE — ED Notes (Signed)
Breakfast Ordered 

## 2021-07-23 NOTE — ED Notes (Signed)
Spoke with Behavioral Health case manager, Chryl Heck. Attempted to get number for [facility]. States they are still trying to find a way to transport the patient at this time and will call back with more information

## 2021-07-24 DIAGNOSIS — F1994 Other psychoactive substance use, unspecified with psychoactive substance-induced mood disorder: Secondary | ICD-10-CM | POA: Diagnosis not present

## 2021-07-24 DIAGNOSIS — Z79899 Other long term (current) drug therapy: Secondary | ICD-10-CM | POA: Diagnosis not present

## 2021-07-24 NOTE — ED Notes (Signed)
Patient resting well with no sxs of distress noted - will continue to monitor for safety 
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
## 2021-07-24 NOTE — ED Notes (Signed)
Patient resting well with no sxs of distress - will continue to monitor

## 2021-07-24 NOTE — Progress Notes (Signed)
Patient resting with eyes closed.  Respirations even and unlabored.  Continue to monitor for safety.

## 2021-07-24 NOTE — ED Notes (Signed)
Pt in shower. No acute distress noted. Safety maintained.

## 2021-07-24 NOTE — Group Note (Signed)
Group Topic: Social Support  Group Date: 07/24/2021 Start Time: 1930 End Time: 2000 Facilitators: Armandina Stammer, [place]  Department: [facility]  Number of Participants: 2  Group Focus: acceptance, check in, communication, and coping skills Treatment Modality:  Cognitive Behavioral Therapy Interventions utilized were clarification, exploration, problem solving, story telling, and support Purpose: express feelings, improve communication skills, and increase insight  Name: Jonathan Montgomery Date of Birth: 05-Sep-1991  MR: [id number]    Level of Participation: active Quality of Participation: attentive, engaged, initiates communication, and motivated Interactions with others: gave feedback and monopolizing Mood/Affect: appropriate and bright Triggers (if applicable): N/A Cognition: coherent/clear, goal directed, and insightful Progress: Significant Response: N/A Plan: follow-up needed  [name] states that he has made new insights. One of which is that when he restarts medication he becomes irritable and overly prideful. Once the medication gets in his system he "levels out".  Patients Problems:  Patient Active Problem List   Diagnosis Date Noted  . Psychoactive substance-induced mood disorder (HCC) 02/06/2021  . Substance induced mood disorder (HCC) 01/27/2021

## 2021-07-24 NOTE — ED Notes (Signed)
Pt eating dinner in no acute distress. Safety maintained. 

## 2021-07-24 NOTE — ED Provider Notes (Signed)
Behavioral Health Progress Note  Date and Time: 07/24/2021 12:59 PM Name: Jonathan Montgomery MRN:  [id number]  Subjective: Patient seen and reevaluated face-to-face by this provider and chart reviewed. He is observed to be lying down in bed but awakens to speak with this provider. On evaluation, he is alert and oriented x4. His thought process is logical and speech just is just coherent. His mood is dysphoric and affect is congruent. He reports feeling good today but states he felt a little tired this morning. He reports sleeping good last night and reports sleeping 6 to 7 hour. He reports that when he woke up this morning he felt a little depressed and described his symptoms as sadness. He denies feeling depressed or anxious at this time. He reports having a fair appetite. He denies having thoughts of wanting to hurt himself or others. He denies auditory and visual hallucinations. He does not appear  to be responding to internal or external stimuli. He denies opiate or benzo withdrawal symptoms. He was informed that his lithium level is 0.06. He states that he is tolerating the lithium without any side effects. He denies any additional concerns at this time.   Diagnosis:  Final diagnoses:  Substance induced mood disorder (HCC)    Total Time spent with patient: 15 minutes  Past Psychiatric History: Bipolar 2 Past Medical History:  Past Medical History:  Diagnosis Date   ADHD    Bipolar 1 disorder (HCC)    Borderline personality disorder (HCC)    GERD (gastroesophageal reflux disease)     Past Surgical History:  Procedure Laterality Date   OPEN REDUCTION INTERNAL FIXATION (ORIF) DISTAL RADIAL FRACTURE Right 02/05/2021   Procedure: OPEN REDUCTION INTERNAL FIXATION (ORIF) DISTAL RADIAL FRACTURE and Irrigation & debridement of open fracture;  Surgeon: Betha Loa, MD;  Location: [facility] OR;  Service: Orthopedics;  Laterality: Right;   Family Psychiatric  History: Mother, brother and sister diagnosed  with Bipolar dx Social History:  Social History   Substance and Sexual Activity  Alcohol Use None     Social History   Substance and Sexual Activity  Drug Use Not on file    Social History   Socioeconomic History   Marital status: Single    Spouse name: Not on file   Number of children: Not on file   Years of education: Not on file   Highest education level: Not on file  Occupational History   Not on file  Tobacco Use   Smoking status: Never   Smokeless tobacco: Never  Substance and Sexual Activity   Alcohol use: Not on file   Drug use: Not on file   Sexual activity: Not on file  Other Topics Concern   Not on file  Social History Narrative   Not on file   Social Determinants of Health   Financial Resource Strain: Not on file  Food Insecurity: Not on file  Transportation Needs: Not on file  Physical Activity: Not on file  Stress: Not on file  Social Connections: Not on file   SDOH:  SDOH Screenings   Alcohol Screen: Not on file  Depression (PHQ2-9): Medium Risk   PHQ-2 Score: 10  Financial Resource Strain: Not on file  Food Insecurity: Not on file  Housing: Not on file  Physical Activity: Not on file  Social Connections: Not on file  Stress: Not on file  Tobacco Use: Low Risk    Smoking Tobacco Use: Never   Smokeless Tobacco Use: Never  Transportation Needs:  Not on file   Additional Social History:    Current Medications:  Current Facility-Administered Medications  Medication Dose Route Frequency Provider Last Rate Last Admin   acetaminophen (TYLENOL) tablet 650 mg  650 mg Oral Q6H PRN Korina Tretter L, NP       alum & mag hydroxide-simeth (MAALOX/MYLANTA) 200-200-20 MG/5ML suspension 30 mL  30 mL Oral Q4H PRN Violeta Lecount L, NP       cloNIDine (CATAPRES) tablet 0.1 mg  0.1 mg Oral Q6H PRN Nyheim Seufert L, NP       FLUoxetine (PROZAC) capsule 20 mg  20 mg Oral Daily Marcel Gary L, NP   20 mg at 07/24/21 0955   hydrOXYzine (ATARAX/VISTARIL)  tablet 25 mg  25 mg Oral TID PRN Alvah Lagrow L, NP       lithium carbonate capsule 150 mg  150 mg Oral BID WC Timia Casselman L, NP   150 mg at 07/24/21 0955   loperamide (IMODIUM) capsule 2-4 mg  2-4 mg Oral PRN Derionna Salvador L, NP       LORazepam (ATIVAN) tablet 1 mg  1 mg Oral Q6H PRN Zahi Plaskett L, NP       magnesium hydroxide (MILK OF MAGNESIA) suspension 30 mL  30 mL Oral Daily PRN Shereena Berquist L, NP       multivitamin with minerals tablet 1 tablet  1 tablet Oral Daily Adaia Matthies L, NP   1 tablet at 07/24/21 0955   ondansetron (ZOFRAN-ODT) disintegrating tablet 4 mg  4 mg Oral Q6H PRN Isabell Bonafede L, NP       thiamine tablet 100 mg  100 mg Oral Daily Yanin Muhlestein L, NP   100 mg at 07/24/21 0955   traZODone (DESYREL) tablet 50 mg  50 mg Oral QHS PRN Hansel Devan L, NP   50 mg at 07/23/21 2113   Current Outpatient Medications  Medication Sig Dispense Refill   FLUoxetine (PROZAC) 20 MG capsule Take 20 mg by mouth daily.     HYDROcodone-acetaminophen (NORCO) 5-325 MG tablet 1-2 tabs po q6 hours prn pain (Patient not taking: Reported on 07/22/2021) 15 tablet 0   lithium carbonate 150 MG capsule Take 150 mg by mouth 2 (two) times daily with a meal.     sulfamethoxazole-trimethoprim (BACTRIM DS) 800-160 MG tablet Take 1 tablet by mouth 2 (two) times daily. (Patient not taking: Reported on 07/22/2021) 14 tablet 0   traZODone (DESYREL) 50 MG tablet Take 50 mg by mouth at bedtime.      Labs  Lab Results:  Admission on 07/23/2021  Component Date Value Ref Range Status   Hgb A1c MFr Bld 07/23/2021 5.5  4.8 - 5.6 % Final   Comment: (NOTE) Pre diabetes:          5.7%-6.4%  Diabetes:              >6.4%  Glycemic control for   <7.0% adults with diabetes    Mean Plasma Glucose 07/23/2021 111.15  mg/dL Final   Performed at [facility], [address] [place], [place] [address]   Cholesterol 07/23/2021 142  0 - 200 mg/dL Final   Triglycerides 29/56/2130 135  <150 mg/dL  Final   HDL 86/57/8469 53  >40 mg/dL Final   Total CHOL/HDL Ratio 07/23/2021 2.7  RATIO Final   VLDL 07/23/2021 27  0 - 40 mg/dL Final   LDL Cholesterol 07/23/2021 62  0 - 99 mg/dL Final   Comment:  Total Cholesterol/HDL:CHD Risk Coronary Heart Disease Risk Table                     Men   Women  1/2 Average Risk   3.4   3.3  Average Risk       5.0   4.4  2 X Average Risk   9.6   7.1  3 X Average Risk  23.4   11.0        Use the calculated Patient Ratio above and the CHD Risk Table to determine the patient's CHD Risk.        ATP III CLASSIFICATION (LDL):  <100     mg/dL   Optimal  831-517  mg/dL   Near or Above                    Optimal  130-159  mg/dL   Borderline  616-073  mg/dL   High  >710     mg/dL   Very High Performed at [facility], [address] [place], [place] [address]    TSH 07/23/2021 4.007  0.350 - 4.500 uIU/mL Final   Comment: Performed by a 3rd Generation assay with a functional sensitivity of <=0.01 uIU/mL. Performed at [facility], [address], [place], [place] [address]    Lithium Lvl 07/23/2021 <0.06 (A) 0.60 - 1.20 mmol/L Final   Performed at [facility] Lab, [address], [place], [place] [address]  Admission on 07/21/2021, Discharged on 07/23/2021  Component Date Value Ref Range Status   Sodium 07/21/2021 137  135 - 145 mmol/L Final   Potassium 07/21/2021 3.8  3.5 - 5.1 mmol/L Final   Chloride 07/21/2021 100  98 - 111 mmol/L Final   CO2 07/21/2021 28  22 - 32 mmol/L Final   Glucose, Bld 07/21/2021 98  70 - 99 mg/dL Final   Glucose reference range applies only to samples taken after fasting for at least 8 hours.   BUN 07/21/2021 8  6 - 20 mg/dL Final   Creatinine, Ser 07/21/2021 1.11  0.61 - 1.24 mg/dL Final   Calcium 09/38/1829 9.3  8.9 - 10.3 mg/dL Final   Total Protein 93/71/6967 6.9  6.5 - 8.1 g/dL Final   Albumin 89/38/1017 4.1  3.5 - 5.0 g/dL Final   AST 51/11/5850 72 (A) 15 - 41 U/L Final   ALT 07/21/2021 43  0  - 44 U/L Final   Alkaline Phosphatase 07/21/2021 73  38 - 126 U/L Final   Total Bilirubin 07/21/2021 1.5 (A) 0.3 - 1.2 mg/dL Final   GFR, Estimated 07/21/2021 >60  >60 mL/min Final   Comment: (NOTE) Calculated using the CKD-EPI Creatinine Equation (2021)    Anion gap 07/21/2021 9  5 - 15 Final   Performed at [facility] Lab, [address], [place], [place] [address]   Alcohol, Ethyl (B) 07/21/2021 <10  <10 mg/dL Final   Comment: (NOTE) Lowest detectable limit for serum alcohol is 10 mg/dL.  For medical purposes only. Performed at [facility], [address] [place], [place] [address]    WBC 07/21/2021 7.1  4.0 - 10.5 K/uL Final   RBC 07/21/2021 5.31  4.22 - 5.81 MIL/uL Final   Hemoglobin 07/21/2021 14.4  13.0 - 17.0 g/dL Final   HCT 14/43/1540 45.5  39.0 - 52.0 % Final   MCV 07/21/2021 85.7  80.0 - 100.0 fL Final   MCH 07/21/2021 27.1  26.0 - 34.0 pg Final   MCHC 07/21/2021 31.6  30.0 - 36.0 g/dL Final   RDW 41/32/4401 14.3  11.5 - 15.5 % Final   Platelets 07/21/2021 247  150 - 400 K/uL Final   nRBC 07/21/2021 0.0  0.0 - 0.2 % Final   Performed at [facility] Lab, [address] [place], [place] [address]   Opiates 07/21/2021 NONE DETECTED  NONE DETECTED Final   Cocaine 07/21/2021 NONE DETECTED  NONE DETECTED Final   Benzodiazepines 07/21/2021 POSITIVE (A) NONE DETECTED Final   Amphetamines 07/21/2021 POSITIVE (A) NONE DETECTED Final   Tetrahydrocannabinol 07/21/2021 POSITIVE (A) NONE DETECTED Final   Barbiturates 07/21/2021 NONE DETECTED  NONE DETECTED Final   Comment: (NOTE) DRUG SCREEN FOR MEDICAL PURPOSES ONLY.  IF CONFIRMATION IS NEEDED FOR ANY PURPOSE, NOTIFY LAB WITHIN 5 DAYS.  LOWEST DETECTABLE LIMITS FOR URINE DRUG SCREEN Drug Class                     Cutoff (ng/mL) Amphetamine and metabolites    1000 Barbiturate and metabolites    200 Benzodiazepine                 200 Tricyclics and metabolites     300 Opiates and metabolites         300 Cocaine and metabolites        300 THC                            50 Performed at [facility] Lab, [address], [place], [place] [address]    SARS Coronavirus 2 by RT PCR 07/23/2021 NEGATIVE  NEGATIVE Final   Comment: (NOTE) SARS-CoV-2 target nucleic acids are NOT DETECTED.  The SARS-CoV-2 RNA is generally detectable in upper respiratory specimens during the acute phase of infection. The lowest concentration of SARS-CoV-2 viral copies this assay can detect is 138 copies/mL. A negative result does not preclude SARS-Cov-2 infection and should not be used as the sole basis for treatment or other patient management decisions. A negative result may occur with  improper specimen collection/handling, submission of specimen other than nasopharyngeal swab, presence of viral mutation(s) within the areas targeted by this assay, and inadequate number of viral copies(<138 copies/mL). A negative result must be combined with clinical observations, patient history, and epidemiological information. The expected result is Negative.  Fact Sheet for Patients:  BloggerCourse.com  Fact Sheet for Healthcare Providers:  SeriousBroker.it  This test is no                          t yet approved or cleared by the [place] FDA and  has been authorized for detection and/or diagnosis of SARS-CoV-2 by FDA under an Emergency Use Authorization (EUA). This EUA will remain  in effect (meaning this test can be used) for the duration of the COVID-19 declaration under Section 564(b)(1) of the Act, 21 U.S.C.section 360bbb-3(b)(1), unless the authorization is terminated  or revoked sooner.       Influenza A by PCR 07/23/2021 NEGATIVE  NEGATIVE Final   Influenza B by PCR 07/23/2021 NEGATIVE  NEGATIVE Final   Comment: (NOTE) The Xpert Xpress SARS-CoV-2/FLU/RSV plus assay is intended as an aid in the diagnosis of influenza from Nasopharyngeal swab  specimens and should not be used as a sole basis for treatment. Nasal washings and aspirates are unacceptable for Xpert Xpress SARS-CoV-2/FLU/RSV testing.  Fact Sheet for Patients: BloggerCourse.com  Fact Sheet for Healthcare Providers: SeriousBroker.it  This test is not yet approved or cleared by the [place] FDA and has been authorized for detection and/or diagnosis of SARS-CoV-2 by FDA under an Emergency Use Authorization (EUA). This EUA will remain in effect (meaning this test can be used) for the duration of the COVID-19 declaration under Section 564(b)(1) of the Act, 21 U.S.C. section 360bbb-3(b)(1), unless the authorization is terminated or revoked.  Performed at [facility] Lab, [address], [place], [place] [address]   Admission on 07/20/2021, Discharged on 07/21/2021  Component Date Value Ref Range Status   Glucose-Capillary 07/20/2021 94  70 - 99 mg/dL Final   Glucose reference range applies only to samples taken after fasting for at least 8 hours.   Comment 1 07/20/2021 Notify RN   Final   Comment 2 07/20/2021 Document in Chart   Final   Sodium 07/20/2021 135  135 - 145 mmol/L Final   Potassium 07/20/2021 3.8  3.5 - 5.1 mmol/L Final   Chloride 07/20/2021 100  98 - 111 mmol/L Final   CO2 07/20/2021 18 (A) 22 - 32 mmol/L Final   Glucose, Bld 07/20/2021 81  70 - 99 mg/dL Final   Glucose reference range applies only to samples taken after fasting for at least 8 hours.   BUN 07/20/2021 16  6 - 20 mg/dL Final   Creatinine, Ser 07/20/2021 1.34 (A) 0.61 - 1.24 mg/dL Final   Calcium 75/64/3329 9.7  8.9 - 10.3 mg/dL Final   Total Protein 51/88/4166 7.4  6.5 - 8.1 g/dL Final   Albumin 04/21/1600 4.8  3.5 - 5.0 g/dL Final   AST 09/32/3557 79 (A) 15 - 41 U/L Final   ALT 07/20/2021 38  0 - 44 U/L Final   Alkaline Phosphatase 07/20/2021 81  38 - 126 U/L Final   Total Bilirubin 07/20/2021 2.9 (A) 0.3 - 1.2 mg/dL Final    GFR, Estimated 07/20/2021 >60  >60 mL/min Final   Comment: (NOTE) Calculated using the CKD-EPI Creatinine Equation (2021)    Anion gap 07/20/2021 17 (A) 5 - 15 Final   Performed at [facility], [address] [place], [place] [address]   Alcohol, Ethyl (B) 07/20/2021 <10  <10 mg/dL Final   Comment: (NOTE) Lowest detectable limit for serum alcohol is 10 mg/dL.  For medical purposes only. Performed at [facility], [address] [place], [place] [address]    Opiates 07/20/2021 NONE DETECTED  NONE DETECTED Final   Cocaine 07/20/2021 NONE DETECTED  NONE DETECTED Final   Benzodiazepines 07/20/2021 POSITIVE (A) NONE DETECTED Final   Amphetamines 07/20/2021 POSITIVE (A) NONE DETECTED Final   Tetrahydrocannabinol 07/20/2021 POSITIVE (A) NONE DETECTED Final   Barbiturates 07/20/2021 NONE DETECTED  NONE DETECTED Final   Comment: (NOTE) DRUG SCREEN FOR MEDICAL PURPOSES ONLY.  IF CONFIRMATION IS NEEDED FOR ANY PURPOSE, NOTIFY LAB WITHIN 5 DAYS.  LOWEST DETECTABLE LIMITS FOR URINE DRUG SCREEN Drug Class                     Cutoff (ng/mL) Amphetamine and metabolites    1000 Barbiturate and metabolites    200 Benzodiazepine                 200 Tricyclics and metabolites     300 Opiates and metabolites        300 Cocaine and metabolites        300  THC                            50 Performed at [facility], [address] [place], [place] [address]    Color, Urine 07/20/2021 YELLOW  YELLOW Final   APPearance 07/20/2021 HAZY (A) CLEAR Final   Specific Gravity, Urine 07/20/2021 1.015  1.005 - 1.030 Final   pH 07/20/2021 5.0  5.0 - 8.0 Final   Glucose, UA 07/20/2021 NEGATIVE  NEGATIVE mg/dL Final   Hgb urine dipstick 07/20/2021 NEGATIVE  NEGATIVE Final   Bilirubin Urine 07/20/2021 NEGATIVE  NEGATIVE Final   Ketones, ur 07/20/2021 80 (A) NEGATIVE mg/dL Final   Protein, ur 84/13/2440 NEGATIVE  NEGATIVE mg/dL Final   Nitrite 08/19/2535 NEGATIVE  NEGATIVE Final    Leukocytes,Ua 07/20/2021 NEGATIVE  NEGATIVE Final   Performed at [facility], [address], [place], [place] [address]   WBC 07/20/2021 10.9 (A) 4.0 - 10.5 K/uL Final   RBC 07/20/2021 5.55  4.22 - 5.81 MIL/uL Final   Hemoglobin 07/20/2021 15.2  13.0 - 17.0 g/dL Final   HCT 47/42/5956 47.2  39.0 - 52.0 % Final   MCV 07/20/2021 85.0  80.0 - 100.0 fL Final   MCH 07/20/2021 27.4  26.0 - 34.0 pg Final   MCHC 07/20/2021 32.2  30.0 - 36.0 g/dL Final   RDW 38/75/6433 14.6  11.5 - 15.5 % Final   Platelets 07/20/2021 268  150 - 400 K/uL Final   nRBC 07/20/2021 0.0  0.0 - 0.2 % Final   Neutrophils Relative % 07/20/2021 68  % Final   Neutro Abs 07/20/2021 7.4  1.7 - 7.7 K/uL Final   Lymphocytes Relative 07/20/2021 23  % Final   Lymphs Abs 07/20/2021 2.5  0.7 - 4.0 K/uL Final   Monocytes Relative 07/20/2021 8  % Final   Monocytes Absolute 07/20/2021 0.9  0.1 - 1.0 K/uL Final   Eosinophils Relative 07/20/2021 0  % Final   Eosinophils Absolute 07/20/2021 0.0  0.0 - 0.5 K/uL Final   Basophils Relative 07/20/2021 1  % Final   Basophils Absolute 07/20/2021 0.1  0.0 - 0.1 K/uL Final   Immature Granulocytes 07/20/2021 0  % Final   Abs Immature Granulocytes 07/20/2021 0.03  0.00 - 0.07 K/uL Final   Performed at [facility], [address], [place], [place] [address]   Sodium 07/20/2021 133 (A) 135 - 145 mmol/L Final   Potassium 07/20/2021 3.2 (A) 3.5 - 5.1 mmol/L Final   Chloride 07/20/2021 98  98 - 111 mmol/L Final   CO2 07/20/2021 21 (A) 22 - 32 mmol/L Final   Glucose, Bld 07/20/2021 76  70 - 99 mg/dL Final   Glucose reference range applies only to samples taken after fasting for at least 8 hours.   BUN 07/20/2021 14  6 - 20 mg/dL Final   Creatinine, Ser 07/20/2021 1.25 (A) 0.61 - 1.24 mg/dL Final   Calcium 84/16/6063 8.8 (A) 8.9 - 10.3 mg/dL Final   GFR, Estimated 07/20/2021 >60  >60 mL/min Final   Comment: (NOTE) Calculated using the CKD-EPI Creatinine Equation (2021)    Anion  gap 07/20/2021 14  5 - 15 Final   Performed at [facility], [address] [place], [place] [address]   Lactic Acid, Venous 07/20/2021 1.0  0.5 - 1.9 mmol/L Final   Performed at [facility], [address] [place], [place] [address]  Glucose-Capillary 07/20/2021 76  70 - 99 mg/dL Final   Glucose reference range applies only to samples taken after fasting for at least 8 hours.  Admission on 02/05/2021, Discharged on 02/06/2021  Component Date Value Ref Range Status   SARS Coronavirus 2 by RT PCR 02/05/2021 NEGATIVE  NEGATIVE Final   Comment: (NOTE) SARS-CoV-2 target nucleic acids are NOT DETECTED.  The SARS-CoV-2 RNA is generally detectable in upper respiratory specimens during the acute phase of infection. The lowest concentration of SARS-CoV-2 viral copies this assay can detect is 138 copies/mL. A negative result does not preclude SARS-Cov-2 infection and should not be used as the sole basis for treatment or other patient management decisions. A negative result may occur with  improper specimen collection/handling, submission of specimen other than nasopharyngeal swab, presence of viral mutation(s) within the areas targeted by this assay, and inadequate number of viral copies(<138 copies/mL). A negative result must be combined with clinical observations, patient history, and epidemiological information. The expected result is Negative.  Fact Sheet for Patients:  BloggerCourse.com  Fact Sheet for Healthcare Providers:  SeriousBroker.it  This test is no                          t yet approved or cleared by the [place] FDA and  has been authorized for detection and/or diagnosis of SARS-CoV-2 by FDA under an Emergency Use Authorization (EUA). This EUA will remain  in effect (meaning this test can be used) for the duration of the COVID-19 declaration under Section 564(b)(1) of the Act, 21 U.S.C.section  360bbb-3(b)(1), unless the authorization is terminated  or revoked sooner.       Influenza A by PCR 02/05/2021 NEGATIVE  NEGATIVE Final   Influenza B by PCR 02/05/2021 NEGATIVE  NEGATIVE Final   Comment: (NOTE) The Xpert Xpress SARS-CoV-2/FLU/RSV plus assay is intended as an aid in the diagnosis of influenza from Nasopharyngeal swab specimens and should not be used as a sole basis for treatment. Nasal washings and aspirates are unacceptable for Xpert Xpress SARS-CoV-2/FLU/RSV testing.  Fact Sheet for Patients: BloggerCourse.com  Fact Sheet for Healthcare Providers: SeriousBroker.it  This test is not yet approved or cleared by the [place] FDA and has been authorized for detection and/or diagnosis of SARS-CoV-2 by FDA under an Emergency Use Authorization (EUA). This EUA will remain in effect (meaning this test can be used) for the duration of the COVID-19 declaration under Section 564(b)(1) of the Act, 21 U.S.C. section 360bbb-3(b)(1), unless the authorization is terminated or revoked.  Performed at [facility], [address] [place], [place] [address]    Sodium 02/05/2021 134 (A) 135 - 145 mmol/L Final   Potassium 02/05/2021 2.7 (A) 3.5 - 5.1 mmol/L Final   Comment: CRITICAL RESULT CALLED TO, READ BACK BY AND VERIFIED WITH: L.MEEKS RN @ 1527 02/05/2021 BY C.EDENS    Chloride 02/05/2021 97 (A) 98 - 111 mmol/L Final   CO2 02/05/2021 25  22 - 32 mmol/L Final   Glucose, Bld 02/05/2021 103 (A) 70 - 99 mg/dL Final   Glucose reference range applies only to samples taken after fasting for at least 8 hours.   BUN 02/05/2021 10  6 - 20 mg/dL Final   Creatinine, Ser 02/05/2021 1.24  0.61 - 1.24 mg/dL Final   Calcium 19/14/7829 8.8 (A) 8.9 - 10.3 mg/dL Final   Total Protein 56/21/3086 6.3 (A) 6.5 - 8.1 g/dL Final   Albumin 57/84/6962 3.6  3.5 - 5.0 g/dL Final   AST 89/21/1941 140 (A) 15 - 41 U/L Final   ALT 02/05/2021 100  (A) 0 - 44 U/L Final   Alkaline Phosphatase 02/05/2021 57  38 - 126 U/L Final   Total Bilirubin 02/05/2021 1.4 (A) 0.3 - 1.2 mg/dL Final   GFR, Estimated 02/05/2021 >60  >60 mL/min Final   Comment: (NOTE) Calculated using the CKD-EPI Creatinine Equation (2021)    Anion gap 02/05/2021 12  5 - 15 Final   Performed at [facility] Lab, [address] [place], [place] [address]   Sodium 02/05/2021 134 (A) 135 - 145 mmol/L Final   Potassium 02/05/2021 3.0 (A) 3.5 - 5.1 mmol/L Final   Chloride 02/05/2021 95 (A) 98 - 111 mmol/L Final   BUN 02/05/2021 12  6 - 20 mg/dL Final   Creatinine, Ser 02/05/2021 1.10  0.61 - 1.24 mg/dL Final   Glucose, Bld 44/81/8563 103 (A) 70 - 99 mg/dL Final   Glucose reference range applies only to samples taken after fasting for at least 8 hours.   Calcium, Ion 02/05/2021 1.09 (A) 1.15 - 1.40 mmol/L Final   TCO2 02/05/2021 27  22 - 32 mmol/L Final   Hemoglobin 02/05/2021 13.3  13.0 - 17.0 g/dL Final   HCT 14/97/0263 39.0  39.0 - 52.0 % Final   WBC 02/05/2021 7.3  4.0 - 10.5 K/uL Final   RBC 02/05/2021 4.77  4.22 - 5.81 MIL/uL Final   Hemoglobin 02/05/2021 13.3  13.0 - 17.0 g/dL Final   HCT 78/58/8502 39.9  39.0 - 52.0 % Final   MCV 02/05/2021 83.6  80.0 - 100.0 fL Final   MCH 02/05/2021 27.9  26.0 - 34.0 pg Final   MCHC 02/05/2021 33.3  30.0 - 36.0 g/dL Final   RDW 77/41/2878 12.9  11.5 - 15.5 % Final   Platelets 02/05/2021 281  150 - 400 K/uL Final   nRBC 02/05/2021 0.0  0.0 - 0.2 % Final   Performed at [facility] Lab, [address] [place], [place] [address]   Alcohol, Ethyl (B) 02/05/2021 <10  <10 mg/dL Final   Comment: (NOTE) Lowest detectable limit for serum alcohol is 10 mg/dL.  For medical purposes only. Performed at [facility], [address], [place], [place] [address]    Color, Urine 02/06/2021 YELLOW  YELLOW Final   APPearance 02/06/2021 CLEAR  CLEAR Final   Specific Gravity, Urine 02/06/2021 1.034 (A) 1.005 - 1.030 Final   pH  02/06/2021 6.0  5.0 - 8.0 Final   Glucose, UA 02/06/2021 50 (A) NEGATIVE mg/dL Final   Hgb urine dipstick 02/06/2021 NEGATIVE  NEGATIVE Final   Bilirubin Urine 02/06/2021 NEGATIVE  NEGATIVE Final   Ketones, ur 02/06/2021 20 (A) NEGATIVE mg/dL Final   Protein, ur 96/28/3662 NEGATIVE  NEGATIVE mg/dL Final   Nitrite 94/76/5465 NEGATIVE  NEGATIVE Final   Leukocytes,Ua 02/06/2021 NEGATIVE  NEGATIVE Final   Performed at [facility] Lab, [address], [place], [place] [address]   Lactic Acid, Venous 02/05/2021 3.0 (A) 0.5 - 1.9 mmol/L Final   Comment: CRITICAL RESULT CALLED TO, READ BACK BY AND VERIFIED WITH: L.MEEKS RN @ 1529 02/05/2021 BY C.EDENS Performed at [facility], [address], [place], [place] [address]    Prothrombin Time 02/05/2021 13.5  11.4 - 15.2 seconds Final   INR 02/05/2021 1.0  0.8 - 1.2 Final   Comment: (NOTE) INR goal varies based on device and disease states.  Performed at [facility], [address], [place], [place] [address]    Blood Bank Specimen 02/05/2021 SAMPLE AVAILABLE FOR TESTING   Final   Sample Expiration 02/05/2021    Final                   Value:02/06/2021,2359 Performed at [facility], [address] [place], [place] [address]    Opiates 02/06/2021 NONE DETECTED  NONE DETECTED Final   Cocaine 02/06/2021 NONE DETECTED  NONE DETECTED Final   Benzodiazepines 02/06/2021 POSITIVE (A) NONE DETECTED Final   Amphetamines 02/06/2021 POSITIVE (A) NONE DETECTED Final   Tetrahydrocannabinol 02/06/2021 POSITIVE (A) NONE DETECTED Final   Barbiturates 02/06/2021 NONE DETECTED  NONE DETECTED Final   Comment: (NOTE) DRUG SCREEN FOR MEDICAL PURPOSES ONLY.  IF CONFIRMATION IS NEEDED FOR ANY PURPOSE, NOTIFY LAB WITHIN 5 DAYS.  LOWEST DETECTABLE LIMITS FOR URINE DRUG SCREEN Drug Class                     Cutoff (ng/mL) Amphetamine and metabolites    1000 Barbiturate and metabolites    200 Benzodiazepine                 200 Tricyclics  and metabolites     300 Opiates and metabolites        300 Cocaine and metabolites        300 THC                            50 Performed at [facility], [address], [place], [place] [address]   Admission on 01/26/2021, Discharged on 01/27/2021  Component Date Value Ref Range Status   Sodium 01/26/2021 137  135 - 145 mmol/L Final   Potassium 01/26/2021 3.6  3.5 - 5.1 mmol/L Final   Chloride 01/26/2021 100  98 - 111 mmol/L Final   CO2 01/26/2021 25  22 - 32 mmol/L Final   Glucose, Bld 01/26/2021 109 (A) 70 - 99 mg/dL Final   Glucose reference range applies only to samples taken after fasting for at least 8 hours.   BUN 01/26/2021 17  6 - 20 mg/dL Final   Creatinine, Ser 01/26/2021 1.54 (A) 0.61 - 1.24 mg/dL Final   Calcium 44/31/5400 9.9  8.9 - 10.3 mg/dL Final   Total Protein 86/76/1950 7.6  6.5 - 8.1 g/dL Final   Albumin 93/26/7124 4.7  3.5 - 5.0 g/dL Final   AST 58/06/9832 162 (A) 15 - 41 U/L Final   ALT 01/26/2021 89 (A) 0 - 44 U/L Final   Alkaline Phosphatase 01/26/2021 77  38 - 126 U/L Final   Total Bilirubin 01/26/2021 1.8 (A) 0.3 - 1.2 mg/dL Final   GFR, Estimated 01/26/2021 >60  >60 mL/min Final   Comment: (NOTE) Calculated using the CKD-EPI Creatinine Equation (2021)    Anion gap 01/26/2021 12  5 - 15 Final   Performed at [facility], [address], [place], [place] [address]   Alcohol, Ethyl (B) 01/26/2021 <10  <10 mg/dL Final   Comment: (NOTE) Lowest detectable limit for serum alcohol is 10 mg/dL.  For medical purposes only. Performed at [facility], [address] [place], [place] [address]    WBC 01/26/2021 10.8 (A) 4.0 - 10.5 K/uL Final   RBC 01/26/2021 5.14  4.22 - 5.81 MIL/uL Final   Hemoglobin 01/26/2021 14.7  13.0 - 17.0 g/dL  Final   HCT 01/26/2021 44.4  39.0 - 52.0 % Final   MCV 01/26/2021 86.4  80.0 - 100.0 fL Final   MCH 01/26/2021 28.6  26.0 - 34.0 pg Final   MCHC 01/26/2021 33.1  30.0 - 36.0 g/dL Final   RDW 16/07/9603  13.3  11.5 - 15.5 % Final   Platelets 01/26/2021 332  150 - 400 K/uL Final   nRBC 01/26/2021 0.0  0.0 - 0.2 % Final   Performed at [facility], [address], [place], [place] [address]   Opiates 01/26/2021 NONE DETECTED  NONE DETECTED Final   Cocaine 01/26/2021 POSITIVE (A) NONE DETECTED Final   Benzodiazepines 01/26/2021 NONE DETECTED  NONE DETECTED Final   Amphetamines 01/26/2021 POSITIVE (A) NONE DETECTED Final   Tetrahydrocannabinol 01/26/2021 POSITIVE (A) NONE DETECTED Final   Barbiturates 01/26/2021 NONE DETECTED  NONE DETECTED Final   Comment: (NOTE) DRUG SCREEN FOR MEDICAL PURPOSES ONLY.  IF CONFIRMATION IS NEEDED FOR ANY PURPOSE, NOTIFY LAB WITHIN 5 DAYS.  LOWEST DETECTABLE LIMITS FOR URINE DRUG SCREEN Drug Class                     Cutoff (ng/mL) Amphetamine and metabolites    1000 Barbiturate and metabolites    200 Benzodiazepine                 200 Tricyclics and metabolites     300 Opiates and metabolites        300 Cocaine and metabolites        300 THC                            50 Performed at [facility], [address], [place], [place] [address]    SARS Coronavirus 2 by RT PCR 01/26/2021 NEGATIVE  NEGATIVE Final   Comment: (NOTE) SARS-CoV-2 target nucleic acids are NOT DETECTED.  The SARS-CoV-2 RNA is generally detectable in upper respiratory specimens during the acute phase of infection. The lowest concentration of SARS-CoV-2 viral copies this assay can detect is 138 copies/mL. A negative result does not preclude SARS-Cov-2 infection and should not be used as the sole basis for treatment or other patient management decisions. A negative result may occur with  improper specimen collection/handling, submission of specimen other than nasopharyngeal swab, presence of viral mutation(s) within the areas targeted by this assay, and inadequate number of viral copies(<138 copies/mL). A negative result must be combined with clinical observations,  patient history, and epidemiological information. The expected result is Negative.  Fact Sheet for Patients:  BloggerCourse.com  Fact Sheet for Healthcare Providers:  SeriousBroker.it  This test is no                          t yet approved or cleared by the [place] FDA and  has been authorized for detection and/or diagnosis of SARS-CoV-2 by FDA under an Emergency Use Authorization (EUA). This EUA will remain  in effect (meaning this test can be used) for the duration of the COVID-19 declaration under Section 564(b)(1) of the Act, 21 U.S.C.section 360bbb-3(b)(1), unless the authorization is terminated  or revoked sooner.       Influenza A by PCR 01/26/2021 NEGATIVE  NEGATIVE Final   Influenza B by PCR 01/26/2021 NEGATIVE  NEGATIVE Final   Comment: (NOTE) The Xpert Xpress SARS-CoV-2/FLU/RSV plus assay is intended as an aid in the  diagnosis of influenza from Nasopharyngeal swab specimens and should not be used as a sole basis for treatment. Nasal washings and aspirates are unacceptable for Xpert Xpress SARS-CoV-2/FLU/RSV testing.  Fact Sheet for Patients: BloggerCourse.com  Fact Sheet for Healthcare Providers: SeriousBroker.it  This test is not yet approved or cleared by the [place] FDA and has been authorized for detection and/or diagnosis of SARS-CoV-2 by FDA under an Emergency Use Authorization (EUA). This EUA will remain in effect (meaning this test can be used) for the duration of the COVID-19 declaration under Section 564(b)(1) of the Act, 21 U.S.C. section 360bbb-3(b)(1), unless the authorization is terminated or revoked.  Performed at [facility], [address] [place], [place] [address]     Blood Alcohol level:  Lab Results  Component Value Date   ETH <10 07/21/2021   ETH <10 07/20/2021    Metabolic Disorder Labs: Lab Results  Component  Value Date   HGBA1C 5.5 07/23/2021   MPG 111.15 07/23/2021   No results found for: PROLACTIN Lab Results  Component Value Date   CHOL 142 07/23/2021   TRIG 135 07/23/2021   HDL 53 07/23/2021   CHOLHDL 2.7 07/23/2021   VLDL 27 07/23/2021   LDLCALC 62 07/23/2021    Therapeutic Lab Levels: Lab Results  Component Value Date   LITHIUM <0.06 (L) 07/23/2021   No results found for: VALPROATE No components found for:  CBMZ  Physical Findings   PHQ2-9    Flowsheet Row ED from 07/21/2021 in [facility] EMERGENCY DEPARTMENT  PHQ-2 Total Score 3  PHQ-9 Total Score 10      Flowsheet Row ED from 07/23/2021 in [facility] ED from 07/21/2021 in [facility] EMERGENCY DEPARTMENT ED from 07/20/2021 in [facility] EMERGENCY DEPARTMENT  C-SSRS RISK CATEGORY No Risk Moderate Risk No Risk        Musculoskeletal  Strength & Muscle Tone: within normal limits Gait & Station: normal Patient leans: N/A  Psychiatric Specialty Exam  Presentation  General Appearance: Appropriate for Environment  Eye Contact:Fair  Speech:Clear and Coherent  Speech Volume:  Mood and Affect  Mood:Dysphoric  Affect:Congruent  Thought Process  Thought Processes:Coherent  Descriptions of Associations:Intact  Orientation:Full (Time, Place and Person)  Thought Content:Logical  Diagnosis of Schizophrenia or Schizoaffective disorder in past: No    Hallucinations:Hallucinations: None  Ideas of Reference:None  Suicidal Thoughts:Suicidal Thoughts: No  Homicidal Thoughts:Homicidal Thoughts: No   Sensorium  Memory:Immediate Fair; Recent Fair; Remote Fair  Judgment:Fair  Insight:Fair   Executive Functions  Concentration:Fair  Attention Span:Fair  Recall:Fair  Fund of Knowledge:Fair  Language:Fair   Psychomotor Activity  Psychomotor Activity:Psychomotor Activity: Normal   Assets  Assets:Communication  Skills; Desire for Improvement; Leisure Time; Intimacy; Social Support   Sleep  Sleep:Sleep: Fair Number of Hours of Sleep: 6  Physical Exam  Physical Exam Constitutional:      Appearance: Normal appearance.  HENT:     Head: Normocephalic and atraumatic.     Nose: Nose normal.  Eyes:     Conjunctiva/sclera: Conjunctivae normal.  Cardiovascular:     Rate and Rhythm: Normal rate.  Pulmonary:     Effort: Pulmonary effort is normal.  Musculoskeletal:     Cervical back: Normal range of motion.  Neurological:     Mental Status: He is alert and oriented to person, place, and time.   Review of Systems  Constitutional: Negative.   HENT: Negative.    Eyes: Negative.  Respiratory: Negative.    Cardiovascular: Negative.   Gastrointestinal: Negative.   Genitourinary: Negative.   Musculoskeletal: Negative.   Skin: Negative.   Neurological: Negative.   Endo/Heme/Allergies: Negative.   Blood pressure 119/74, pulse 75, temperature (!) 97.5 F (36.4 C), temperature source Temporal, resp. rate 16, SpO2 97 %. There is no height or weight on file to calculate BMI.  Treatment Plan Summary:Patient admitted to the [facility] for mood stabilization and safety.   Medications:  FLUoxetine  20 mg Oral Daily   lithium carbonate  150 mg Oral BID WC   multivitamin with minerals  1 tablet Oral Daily   thiamine  100 mg Oral Daily    Ativan 1 mg po q 6 hours PRN for alcohol/benzo withdrawal  Clonidine 0.1 mg q 6 hours PRN for opiate withdrawal    Cows scale ordered to assess opiate withdrawal  Ciwa scale ordered assess benzo's withdrawal    Psychotherapy- coping with mood dx and substance abuse   Labs reviewed: Lithium level 0.06   Layla Barter, NP 07/24/2021 12:59 PM

## 2021-07-25 DIAGNOSIS — Z79899 Other long term (current) drug therapy: Secondary | ICD-10-CM | POA: Diagnosis not present

## 2021-07-25 DIAGNOSIS — F1994 Other psychoactive substance use, unspecified with psychoactive substance-induced mood disorder: Secondary | ICD-10-CM | POA: Diagnosis not present

## 2021-07-25 NOTE — ED Provider Notes (Signed)
Behavioral Health Progress Note  Date and Time: 07/25/2021 11:27 AM Name: Jonathan Montgomery MRN:  [id number]  Subjective:  Jonathan Montgomery, 30 y.o., male patient seen face to face by this provider, chart reviewed and discussed with treatment team and Dr. Lucianne Muss on 07/25/21.    On evaluation Jonathan Montgomery is lying in bed asleep.  He awakened easily.  He is disheveled and makes fleeting eye contact.Patient calm/cooperative, alert/oriented x 4. Endorses depression with congruent affect.  States his depression is getting better.  Reports last night he had broken sleep and is feeling fatigued. He does not appear to be responding to internal/external stimuli.  Patient reports eating/sleeping without difficulty, tolerating medications without adverse reactions, and attending/participating in group sessions.  Denies auditory and visual hallucinations. Denies paranoia.  Denies suicidal/homicidal ideations.  Denies withdrawal symptoms.  Patient denies any health concerns.  States his girlfriend is looking up and calling halfway houses and sober living.Reports he is also calling. He has talked with Child psychotherapist concerning resources. Patient states he does not need any resources at this time.  States he plans to be discharged tomorrow. Patient's feelings and progress discussed. Reassurance, support, and encouragement provided.   Diagnosis:  Final diagnoses:  Substance induced mood disorder (HCC)    Total Time spent with patient: 30 minutes  Past Psychiatric History: Bipolar 2 Past Medical History:  Past Medical History:  Diagnosis Date   ADHD    Bipolar 1 disorder (HCC)    Borderline personality disorder (HCC)    GERD (gastroesophageal reflux disease)     Past Surgical History:  Procedure Laterality Date   OPEN REDUCTION INTERNAL FIXATION (ORIF) DISTAL RADIAL FRACTURE Right 02/05/2021   Procedure: OPEN REDUCTION INTERNAL FIXATION (ORIF) DISTAL RADIAL FRACTURE and Irrigation & debridement of open fracture;   Surgeon: Betha Loa, MD;  Location: [facility] OR;  Service: Orthopedics;  Laterality: Right;   Family History: No family history on file. Family Psychiatric  History: mother, brother and sister dx with Bipolar Social History:  Social History   Substance and Sexual Activity  Alcohol Use None     Social History   Substance and Sexual Activity  Drug Use Not on file    Social History   Socioeconomic History   Marital status: Single    Spouse name: Not on file   Number of children: Not on file   Years of education: Not on file   Highest education level: Not on file  Occupational History   Not on file  Tobacco Use   Smoking status: Never   Smokeless tobacco: Never  Substance and Sexual Activity   Alcohol use: Not on file   Drug use: Not on file   Sexual activity: Not on file  Other Topics Concern   Not on file  Social History Narrative   Not on file   Social Determinants of Health   Financial Resource Strain: Not on file  Food Insecurity: Not on file  Transportation Needs: Not on file  Physical Activity: Not on file  Stress: Not on file  Social Connections: Not on file   SDOH:  SDOH Screenings   Alcohol Screen: Not on file  Depression (PHQ2-9): Medium Risk   PHQ-2 Score: 10  Financial Resource Strain: Not on file  Food Insecurity: Not on file  Housing: Not on file  Physical Activity: Not on file  Social Connections: Not on file  Stress: Not on file  Tobacco Use: Low Risk    Smoking Tobacco Use: Never   Smokeless  Tobacco Use: Never  Transportation Needs: Not on file   Additional Social History:     Sleep: Fair  Appetite:  Good  Current Medications:  Current Facility-Administered Medications  Medication Dose Route Frequency Provider Last Rate Last Admin   acetaminophen (TYLENOL) tablet 650 mg  650 mg Oral Q6H PRN White, Patrice L, NP       alum & mag hydroxide-simeth (MAALOX/MYLANTA) 200-200-20 MG/5ML suspension 30 mL  30 mL Oral Q4H PRN White, Patrice L,  NP       cloNIDine (CATAPRES) tablet 0.1 mg  0.1 mg Oral Q6H PRN White, Patrice L, NP       FLUoxetine (PROZAC) capsule 20 mg  20 mg Oral Daily White, Patrice L, NP   20 mg at 07/25/21 0920   hydrOXYzine (ATARAX/VISTARIL) tablet 25 mg  25 mg Oral TID PRN Liborio Nixon L, NP   25 mg at 07/24/21 2115   lithium carbonate capsule 150 mg  150 mg Oral BID WC White, Patrice L, NP   150 mg at 07/25/21 0747   loperamide (IMODIUM) capsule 2-4 mg  2-4 mg Oral PRN White, Patrice L, NP       LORazepam (ATIVAN) tablet 1 mg  1 mg Oral Q6H PRN White, Patrice L, NP       magnesium hydroxide (MILK OF MAGNESIA) suspension 30 mL  30 mL Oral Daily PRN White, Patrice L, NP       multivitamin with minerals tablet 1 tablet  1 tablet Oral Daily White, Patrice L, NP   1 tablet at 07/25/21 0920   ondansetron (ZOFRAN-ODT) disintegrating tablet 4 mg  4 mg Oral Q6H PRN White, Patrice L, NP       thiamine tablet 100 mg  100 mg Oral Daily White, Patrice L, NP   100 mg at 07/25/21 0920   traZODone (DESYREL) tablet 50 mg  50 mg Oral QHS PRN White, Patrice L, NP   50 mg at 07/24/21 2115   Current Outpatient Medications  Medication Sig Dispense Refill   FLUoxetine (PROZAC) 20 MG capsule Take 20 mg by mouth daily.     HYDROcodone-acetaminophen (NORCO) 5-325 MG tablet 1-2 tabs po q6 hours prn pain (Patient not taking: Reported on 07/22/2021) 15 tablet 0   lithium carbonate 150 MG capsule Take 150 mg by mouth 2 (two) times daily with a meal.     sulfamethoxazole-trimethoprim (BACTRIM DS) 800-160 MG tablet Take 1 tablet by mouth 2 (two) times daily. (Patient not taking: Reported on 07/22/2021) 14 tablet 0   traZODone (DESYREL) 50 MG tablet Take 50 mg by mouth at bedtime.      Labs  Lab Results:  Admission on 07/23/2021  Component Date Value Ref Range Status   Hgb A1c MFr Bld 07/23/2021 5.5  4.8 - 5.6 % Final   Comment: (NOTE) Pre diabetes:          5.7%-6.4%  Diabetes:              >6.4%  Glycemic control for   <7.0% adults  with diabetes    Mean Plasma Glucose 07/23/2021 111.15  mg/dL Final   Performed at [facility], [address] [place], [place] [address]   Cholesterol 07/23/2021 142  0 - 200 mg/dL Final   Triglycerides 52/84/1324 135  <150 mg/dL Final   HDL 40/07/2724 53  >40 mg/dL Final   Total CHOL/HDL Ratio 07/23/2021 2.7  RATIO Final   VLDL 07/23/2021 27  0 - 40 mg/dL Final  LDL Cholesterol 07/23/2021 62  0 - 99 mg/dL Final   Comment:        Total Cholesterol/HDL:CHD Risk Coronary Heart Disease Risk Table                     Men   Women  1/2 Average Risk   3.4   3.3  Average Risk       5.0   4.4  2 X Average Risk   9.6   7.1  3 X Average Risk  23.4   11.0        Use the calculated Patient Ratio above and the CHD Risk Table to determine the patient's CHD Risk.        ATP III CLASSIFICATION (LDL):  <100     mg/dL   Optimal  076-226  mg/dL   Near or Above                    Optimal  130-159  mg/dL   Borderline  333-545  mg/dL   High  >625     mg/dL   Very High Performed at [facility], [address], [place], [place] [address]    TSH 07/23/2021 4.007  0.350 - 4.500 uIU/mL Final   Comment: Performed by a 3rd Generation assay with a functional sensitivity of <=0.01 uIU/mL. Performed at [facility], [address], [place], [place] [address]    Lithium Lvl 07/23/2021 <0.06 (A) 0.60 - 1.20 mmol/L Final   Performed at [facility] Lab, [address], [place], [place] [address]  Admission on 07/21/2021, Discharged on 07/23/2021  Component Date Value Ref Range Status   Sodium 07/21/2021 137  135 - 145 mmol/L Final   Potassium 07/21/2021 3.8  3.5 - 5.1 mmol/L Final   Chloride 07/21/2021 100  98 - 111 mmol/L Final   CO2 07/21/2021 28  22 - 32 mmol/L Final   Glucose, Bld 07/21/2021 98  70 - 99 mg/dL Final   Glucose reference range applies only to samples taken after fasting for at least 8 hours.   BUN 07/21/2021 8  6 - 20 mg/dL Final   Creatinine, Ser 07/21/2021 1.11   0.61 - 1.24 mg/dL Final   Calcium 57/26/2035 9.3  8.9 - 10.3 mg/dL Final   Total Protein 59/74/1638 6.9  6.5 - 8.1 g/dL Final   Albumin 45/36/4680 4.1  3.5 - 5.0 g/dL Final   AST 32/09/2481 72 (A) 15 - 41 U/L Final   ALT 07/21/2021 43  0 - 44 U/L Final   Alkaline Phosphatase 07/21/2021 73  38 - 126 U/L Final   Total Bilirubin 07/21/2021 1.5 (A) 0.3 - 1.2 mg/dL Final   GFR, Estimated 07/21/2021 >60  >60 mL/min Final   Comment: (NOTE) Calculated using the CKD-EPI Creatinine Equation (2021)    Anion gap 07/21/2021 9  5 - 15 Final   Performed at [facility] Lab, [address], [place], [place] [address]   Alcohol, Ethyl (B) 07/21/2021 <10  <10 mg/dL Final   Comment: (NOTE) Lowest detectable limit for serum alcohol is 10 mg/dL.  For medical purposes only. Performed at [facility] Lab, [address], [place], [place] [address]    WBC 07/21/2021 7.1  4.0 - 10.5 K/uL Final   RBC 07/21/2021 5.31  4.22 - 5.81 MIL/uL Final   Hemoglobin 07/21/2021 14.4  13.0 - 17.0 g/dL Final   HCT 91/69/4503 45.5  39.0 -  52.0 % Final   MCV 07/21/2021 85.7  80.0 - 100.0 fL Final   MCH 07/21/2021 27.1  26.0 - 34.0 pg Final   MCHC 07/21/2021 31.6  30.0 - 36.0 g/dL Final   RDW 16/07/9603 14.3  11.5 - 15.5 % Final   Platelets 07/21/2021 247  150 - 400 K/uL Final   nRBC 07/21/2021 0.0  0.0 - 0.2 % Final   Performed at [facility], [address] [place], [place] [address]   Opiates 07/21/2021 NONE DETECTED  NONE DETECTED Final   Cocaine 07/21/2021 NONE DETECTED  NONE DETECTED Final   Benzodiazepines 07/21/2021 POSITIVE (A) NONE DETECTED Final   Amphetamines 07/21/2021 POSITIVE (A) NONE DETECTED Final   Tetrahydrocannabinol 07/21/2021 POSITIVE (A) NONE DETECTED Final   Barbiturates 07/21/2021 NONE DETECTED  NONE DETECTED Final   Comment: (NOTE) DRUG SCREEN FOR MEDICAL PURPOSES ONLY.  IF CONFIRMATION IS NEEDED FOR ANY PURPOSE, NOTIFY LAB WITHIN 5 DAYS.  LOWEST DETECTABLE LIMITS FOR URINE  DRUG SCREEN Drug Class                     Cutoff (ng/mL) Amphetamine and metabolites    1000 Barbiturate and metabolites    200 Benzodiazepine                 200 Tricyclics and metabolites     300 Opiates and metabolites        300 Cocaine and metabolites        300 THC                            50 Performed at [facility] Lab, [address] [place], [place] [address]    SARS Coronavirus 2 by RT PCR 07/23/2021 NEGATIVE  NEGATIVE Final   Comment: (NOTE) SARS-CoV-2 target nucleic acids are NOT DETECTED.  The SARS-CoV-2 RNA is generally detectable in upper respiratory specimens during the acute phase of infection. The lowest concentration of SARS-CoV-2 viral copies this assay can detect is 138 copies/mL. A negative result does not preclude SARS-Cov-2 infection and should not be used as the sole basis for treatment or other patient management decisions. A negative result may occur with  improper specimen collection/handling, submission of specimen other than nasopharyngeal swab, presence of viral mutation(s) within the areas targeted by this assay, and inadequate number of viral copies(<138 copies/mL). A negative result must be combined with clinical observations, patient history, and epidemiological information. The expected result is Negative.  Fact Sheet for Patients:  BloggerCourse.com  Fact Sheet for Healthcare Providers:  SeriousBroker.it  This test is no                          t yet approved or cleared by the [place] FDA and  has been authorized for detection and/or diagnosis of SARS-CoV-2 by FDA under an Emergency Use Authorization (EUA). This EUA will remain  in effect (meaning this test can be used) for the duration of the COVID-19 declaration under Section 564(b)(1) of the Act, 21 U.S.C.section 360bbb-3(b)(1), unless the authorization is terminated  or revoked sooner.       Influenza A by PCR  07/23/2021 NEGATIVE  NEGATIVE Final   Influenza B by PCR 07/23/2021 NEGATIVE  NEGATIVE Final   Comment: (NOTE) The Xpert Xpress SARS-CoV-2/FLU/RSV plus assay is intended as an aid in the diagnosis of influenza from Nasopharyngeal swab specimens and should  not be used as a sole basis for treatment. Nasal washings and aspirates are unacceptable for Xpert Xpress SARS-CoV-2/FLU/RSV testing.  Fact Sheet for Patients: BloggerCourse.com  Fact Sheet for Healthcare Providers: SeriousBroker.it  This test is not yet approved or cleared by the [place] FDA and has been authorized for detection and/or diagnosis of SARS-CoV-2 by FDA under an Emergency Use Authorization (EUA). This EUA will remain in effect (meaning this test can be used) for the duration of the COVID-19 declaration under Section 564(b)(1) of the Act, 21 U.S.C. section 360bbb-3(b)(1), unless the authorization is terminated or revoked.  Performed at [facility], [address], [place], [place] [address]   Admission on 07/20/2021, Discharged on 07/21/2021  Component Date Value Ref Range Status   Glucose-Capillary 07/20/2021 94  70 - 99 mg/dL Final   Glucose reference range applies only to samples taken after fasting for at least 8 hours.   Comment 1 07/20/2021 Notify RN   Final   Comment 2 07/20/2021 Document in Chart   Final   Sodium 07/20/2021 135  135 - 145 mmol/L Final   Potassium 07/20/2021 3.8  3.5 - 5.1 mmol/L Final   Chloride 07/20/2021 100  98 - 111 mmol/L Final   CO2 07/20/2021 18 (A) 22 - 32 mmol/L Final   Glucose, Bld 07/20/2021 81  70 - 99 mg/dL Final   Glucose reference range applies only to samples taken after fasting for at least 8 hours.   BUN 07/20/2021 16  6 - 20 mg/dL Final   Creatinine, Ser 07/20/2021 1.34 (A) 0.61 - 1.24 mg/dL Final   Calcium 60/45/4098 9.7  8.9 - 10.3 mg/dL Final   Total Protein 11/91/4782 7.4  6.5 - 8.1 g/dL Final   Albumin  95/62/1308 4.8  3.5 - 5.0 g/dL Final   AST 65/78/4696 79 (A) 15 - 41 U/L Final   ALT 07/20/2021 38  0 - 44 U/L Final   Alkaline Phosphatase 07/20/2021 81  38 - 126 U/L Final   Total Bilirubin 07/20/2021 2.9 (A) 0.3 - 1.2 mg/dL Final   GFR, Estimated 07/20/2021 >60  >60 mL/min Final   Comment: (NOTE) Calculated using the CKD-EPI Creatinine Equation (2021)    Anion gap 07/20/2021 17 (A) 5 - 15 Final   Performed at [facility], [address] [place], [place] [address]   Alcohol, Ethyl (B) 07/20/2021 <10  <10 mg/dL Final   Comment: (NOTE) Lowest detectable limit for serum alcohol is 10 mg/dL.  For medical purposes only. Performed at [facility], [address] [place], [place] [address]    Opiates 07/20/2021 NONE DETECTED  NONE DETECTED Final   Cocaine 07/20/2021 NONE DETECTED  NONE DETECTED Final   Benzodiazepines 07/20/2021 POSITIVE (A) NONE DETECTED Final   Amphetamines 07/20/2021 POSITIVE (A) NONE DETECTED Final   Tetrahydrocannabinol 07/20/2021 POSITIVE (A) NONE DETECTED Final   Barbiturates 07/20/2021 NONE DETECTED  NONE DETECTED Final   Comment: (NOTE) DRUG SCREEN FOR MEDICAL PURPOSES ONLY.  IF CONFIRMATION IS NEEDED FOR ANY PURPOSE, NOTIFY LAB WITHIN 5 DAYS.  LOWEST DETECTABLE LIMITS FOR URINE DRUG SCREEN Drug Class                     Cutoff (ng/mL) Amphetamine and metabolites    1000 Barbiturate and metabolites    200 Benzodiazepine                 200 Tricyclics and metabolites     300 Opiates  and metabolites        300 Cocaine and metabolites        300 THC                            50 Performed at [facility], [address] [place], [place] [address]    Color, Urine 07/20/2021 YELLOW  YELLOW Final   APPearance 07/20/2021 HAZY (A) CLEAR Final   Specific Gravity, Urine 07/20/2021 1.015  1.005 - 1.030 Final   pH 07/20/2021 5.0  5.0 - 8.0 Final   Glucose, UA 07/20/2021 NEGATIVE  NEGATIVE mg/dL Final   Hgb urine dipstick 07/20/2021  NEGATIVE  NEGATIVE Final   Bilirubin Urine 07/20/2021 NEGATIVE  NEGATIVE Final   Ketones, ur 07/20/2021 80 (A) NEGATIVE mg/dL Final   Protein, ur 60/45/4098 NEGATIVE  NEGATIVE mg/dL Final   Nitrite 11/91/4782 NEGATIVE  NEGATIVE Final   Leukocytes,Ua 07/20/2021 NEGATIVE  NEGATIVE Final   Performed at [facility], [address] [place], [place] [address]   WBC 07/20/2021 10.9 (A) 4.0 - 10.5 K/uL Final   RBC 07/20/2021 5.55  4.22 - 5.81 MIL/uL Final   Hemoglobin 07/20/2021 15.2  13.0 - 17.0 g/dL Final   HCT 30/86/5784 47.2  39.0 - 52.0 % Final   MCV 07/20/2021 85.0  80.0 - 100.0 fL Final   MCH 07/20/2021 27.4  26.0 - 34.0 pg Final   MCHC 07/20/2021 32.2  30.0 - 36.0 g/dL Final   RDW 69/62/9528 14.6  11.5 - 15.5 % Final   Platelets 07/20/2021 268  150 - 400 K/uL Final   nRBC 07/20/2021 0.0  0.0 - 0.2 % Final   Neutrophils Relative % 07/20/2021 68  % Final   Neutro Abs 07/20/2021 7.4  1.7 - 7.7 K/uL Final   Lymphocytes Relative 07/20/2021 23  % Final   Lymphs Abs 07/20/2021 2.5  0.7 - 4.0 K/uL Final   Monocytes Relative 07/20/2021 8  % Final   Monocytes Absolute 07/20/2021 0.9  0.1 - 1.0 K/uL Final   Eosinophils Relative 07/20/2021 0  % Final   Eosinophils Absolute 07/20/2021 0.0  0.0 - 0.5 K/uL Final   Basophils Relative 07/20/2021 1  % Final   Basophils Absolute 07/20/2021 0.1  0.0 - 0.1 K/uL Final   Immature Granulocytes 07/20/2021 0  % Final   Abs Immature Granulocytes 07/20/2021 0.03  0.00 - 0.07 K/uL Final   Performed at [facility], [address], [place], [place] [address]   Sodium 07/20/2021 133 (A) 135 - 145 mmol/L Final   Potassium 07/20/2021 3.2 (A) 3.5 - 5.1 mmol/L Final   Chloride 07/20/2021 98  98 - 111 mmol/L Final   CO2 07/20/2021 21 (A) 22 - 32 mmol/L Final   Glucose, Bld 07/20/2021 76  70 - 99 mg/dL Final   Glucose reference range applies only to samples taken after fasting for at least 8 hours.   BUN 07/20/2021 14  6 - 20 mg/dL Final   Creatinine,  Ser 07/20/2021 1.25 (A) 0.61 - 1.24 mg/dL Final   Calcium 40/07/2724 8.8 (A) 8.9 - 10.3 mg/dL Final   GFR, Estimated 07/20/2021 >60  >60 mL/min Final   Comment: (NOTE) Calculated using the CKD-EPI Creatinine Equation (2021)    Anion gap 07/20/2021 14  5 - 15 Final   Performed at [facility], [address] [place], [place] [address]   Lactic Acid, Venous 07/20/2021 1.0  0.5 - 1.9 mmol/L Final   Performed at [facility], [address] [place], [place] [address]   Glucose-Capillary 07/20/2021 76  70 - 99 mg/dL Final   Glucose reference range applies only to samples taken after fasting for at least 8 hours.  Admission on 02/05/2021, Discharged on 02/06/2021  Component Date Value Ref Range Status   SARS Coronavirus 2 by RT PCR 02/05/2021 NEGATIVE  NEGATIVE Final   Comment: (NOTE) SARS-CoV-2 target nucleic acids are NOT DETECTED.  The SARS-CoV-2 RNA is generally detectable in upper respiratory specimens during the acute phase of infection. The lowest concentration of SARS-CoV-2 viral copies this assay can detect is 138 copies/mL. A negative result does not preclude SARS-Cov-2 infection and should not be used as the sole basis for treatment or other patient management decisions. A negative result may occur with  improper specimen collection/handling, submission of specimen other than nasopharyngeal swab, presence of viral mutation(s) within the areas targeted by this assay, and inadequate number of viral copies(<138 copies/mL). A negative result must be combined with clinical observations, patient history, and epidemiological information. The expected result is Negative.  Fact Sheet for Patients:  BloggerCourse.com  Fact Sheet for Healthcare Providers:  SeriousBroker.it  This test is no                          t yet approved or cleared by the [place] FDA and  has been authorized for detection and/or diagnosis of  SARS-CoV-2 by FDA under an Emergency Use Authorization (EUA). This EUA will remain  in effect (meaning this test can be used) for the duration of the COVID-19 declaration under Section 564(b)(1) of the Act, 21 U.S.C.section 360bbb-3(b)(1), unless the authorization is terminated  or revoked sooner.       Influenza A by PCR 02/05/2021 NEGATIVE  NEGATIVE Final   Influenza B by PCR 02/05/2021 NEGATIVE  NEGATIVE Final   Comment: (NOTE) The Xpert Xpress SARS-CoV-2/FLU/RSV plus assay is intended as an aid in the diagnosis of influenza from Nasopharyngeal swab specimens and should not be used as a sole basis for treatment. Nasal washings and aspirates are unacceptable for Xpert Xpress SARS-CoV-2/FLU/RSV testing.  Fact Sheet for Patients: BloggerCourse.com  Fact Sheet for Healthcare Providers: SeriousBroker.it  This test is not yet approved or cleared by the [place] FDA and has been authorized for detection and/or diagnosis of SARS-CoV-2 by FDA under an Emergency Use Authorization (EUA). This EUA will remain in effect (meaning this test can be used) for the duration of the COVID-19 declaration under Section 564(b)(1) of the Act, 21 U.S.C. section 360bbb-3(b)(1), unless the authorization is terminated or revoked.  Performed at [facility], [address] [place], [place] [address]    Sodium 02/05/2021 134 (A) 135 - 145 mmol/L Final   Potassium 02/05/2021 2.7 (A) 3.5 - 5.1 mmol/L Final   Comment: CRITICAL RESULT CALLED TO, READ BACK BY AND VERIFIED WITH: L.MEEKS RN @ 1527 02/05/2021 BY C.EDENS    Chloride 02/05/2021 97 (A) 98 - 111 mmol/L Final   CO2 02/05/2021 25  22 - 32 mmol/L Final   Glucose, Bld 02/05/2021 103 (A) 70 - 99 mg/dL Final   Glucose reference range applies only to samples taken after fasting for at least 8 hours.   BUN 02/05/2021 10  6 - 20 mg/dL Final   Creatinine, Ser 02/05/2021 1.24  0.61 - 1.24 mg/dL  Final   Calcium 09/81/1914 8.8 (A)  8.9 - 10.3 mg/dL Final   Total Protein 91/47/8295 6.3 (A) 6.5 - 8.1 g/dL Final   Albumin 62/13/0865 3.6  3.5 - 5.0 g/dL Final   AST 78/46/9629 140 (A) 15 - 41 U/L Final   ALT 02/05/2021 100 (A) 0 - 44 U/L Final   Alkaline Phosphatase 02/05/2021 57  38 - 126 U/L Final   Total Bilirubin 02/05/2021 1.4 (A) 0.3 - 1.2 mg/dL Final   GFR, Estimated 02/05/2021 >60  >60 mL/min Final   Comment: (NOTE) Calculated using the CKD-EPI Creatinine Equation (2021)    Anion gap 02/05/2021 12  5 - 15 Final   Performed at [facility] Lab, [address], [place], [place] [address]   Sodium 02/05/2021 134 (A) 135 - 145 mmol/L Final   Potassium 02/05/2021 3.0 (A) 3.5 - 5.1 mmol/L Final   Chloride 02/05/2021 95 (A) 98 - 111 mmol/L Final   BUN 02/05/2021 12  6 - 20 mg/dL Final   Creatinine, Ser 02/05/2021 1.10  0.61 - 1.24 mg/dL Final   Glucose, Bld 32/44/0102 103 (A) 70 - 99 mg/dL Final   Glucose reference range applies only to samples taken after fasting for at least 8 hours.   Calcium, Ion 02/05/2021 1.09 (A) 1.15 - 1.40 mmol/L Final   TCO2 02/05/2021 27  22 - 32 mmol/L Final   Hemoglobin 02/05/2021 13.3  13.0 - 17.0 g/dL Final   HCT 72/53/6644 39.0  39.0 - 52.0 % Final   WBC 02/05/2021 7.3  4.0 - 10.5 K/uL Final   RBC 02/05/2021 4.77  4.22 - 5.81 MIL/uL Final   Hemoglobin 02/05/2021 13.3  13.0 - 17.0 g/dL Final   HCT 03/47/4259 39.9  39.0 - 52.0 % Final   MCV 02/05/2021 83.6  80.0 - 100.0 fL Final   MCH 02/05/2021 27.9  26.0 - 34.0 pg Final   MCHC 02/05/2021 33.3  30.0 - 36.0 g/dL Final   RDW 56/38/7564 12.9  11.5 - 15.5 % Final   Platelets 02/05/2021 281  150 - 400 K/uL Final   nRBC 02/05/2021 0.0  0.0 - 0.2 % Final   Performed at [facility], [address] [place], [place] [address]   Alcohol, Ethyl (B) 02/05/2021 <10  <10 mg/dL Final   Comment: (NOTE) Lowest detectable limit for serum alcohol is 10 mg/dL.  For medical purposes only. Performed at  [facility], [address], [place], [place] [address]    Color, Urine 02/06/2021 YELLOW  YELLOW Final   APPearance 02/06/2021 CLEAR  CLEAR Final   Specific Gravity, Urine 02/06/2021 1.034 (A) 1.005 - 1.030 Final   pH 02/06/2021 6.0  5.0 - 8.0 Final   Glucose, UA 02/06/2021 50 (A) NEGATIVE mg/dL Final   Hgb urine dipstick 02/06/2021 NEGATIVE  NEGATIVE Final   Bilirubin Urine 02/06/2021 NEGATIVE  NEGATIVE Final   Ketones, ur 02/06/2021 20 (A) NEGATIVE mg/dL Final   Protein, ur 66/03/3015 NEGATIVE  NEGATIVE mg/dL Final   Nitrite 10/31/3233 NEGATIVE  NEGATIVE Final   Leukocytes,Ua 02/06/2021 NEGATIVE  NEGATIVE Final   Performed at [facility] Lab, [address], [place], [place] [id number]   Lactic Acid, Venous 02/05/2021 3.0 (A) 0.5 - 1.9 mmol/L Final   Comment: CRITICAL RESULT CALLED TO, READ BACK BY AND VERIFIED WITH: L.MEEKS RN @ 1529 02/05/2021 BY C.EDENS Performed at [facility], [address], [place], [place] [address]    Prothrombin Time 02/05/2021 13.5  11.4 - 15.2 seconds Final  INR 02/05/2021 1.0  0.8 - 1.2 Final   Comment: (NOTE) INR goal varies based on device and disease states. Performed at [facility], [address] [place], [place] [address]    Blood Bank Specimen 02/05/2021 SAMPLE AVAILABLE FOR TESTING   Final   Sample Expiration 02/05/2021    Final                   Value:02/06/2021,2359 Performed at [facility], [address] [place], [place] [address]    Opiates 02/06/2021 NONE DETECTED  NONE DETECTED Final   Cocaine 02/06/2021 NONE DETECTED  NONE DETECTED Final   Benzodiazepines 02/06/2021 POSITIVE (A) NONE DETECTED Final   Amphetamines 02/06/2021 POSITIVE (A) NONE DETECTED Final   Tetrahydrocannabinol 02/06/2021 POSITIVE (A) NONE DETECTED Final   Barbiturates 02/06/2021 NONE DETECTED  NONE DETECTED Final   Comment: (NOTE) DRUG SCREEN FOR MEDICAL PURPOSES ONLY.  IF CONFIRMATION IS NEEDED FOR ANY PURPOSE, NOTIFY  LAB WITHIN 5 DAYS.  LOWEST DETECTABLE LIMITS FOR URINE DRUG SCREEN Drug Class                     Cutoff (ng/mL) Amphetamine and metabolites    1000 Barbiturate and metabolites    200 Benzodiazepine                 200 Tricyclics and metabolites     300 Opiates and metabolites        300 Cocaine and metabolites        300 THC                            50 Performed at [facility], [address] [place], [place] [address]   Admission on 01/26/2021, Discharged on 01/27/2021  Component Date Value Ref Range Status   Sodium 01/26/2021 137  135 - 145 mmol/L Final   Potassium 01/26/2021 3.6  3.5 - 5.1 mmol/L Final   Chloride 01/26/2021 100  98 - 111 mmol/L Final   CO2 01/26/2021 25  22 - 32 mmol/L Final   Glucose, Bld 01/26/2021 109 (A) 70 - 99 mg/dL Final   Glucose reference range applies only to samples taken after fasting for at least 8 hours.   BUN 01/26/2021 17  6 - 20 mg/dL Final   Creatinine, Ser 01/26/2021 1.54 (A) 0.61 - 1.24 mg/dL Final   Calcium 02/54/2706 9.9  8.9 - 10.3 mg/dL Final   Total Protein 23/76/2831 7.6  6.5 - 8.1 g/dL Final   Albumin 51/76/1607 4.7  3.5 - 5.0 g/dL Final   AST 37/07/6268 162 (A) 15 - 41 U/L Final   ALT 01/26/2021 89 (A) 0 - 44 U/L Final   Alkaline Phosphatase 01/26/2021 77  38 - 126 U/L Final   Total Bilirubin 01/26/2021 1.8 (A) 0.3 - 1.2 mg/dL Final   GFR, Estimated 01/26/2021 >60  >60 mL/min Final   Comment: (NOTE) Calculated using the CKD-EPI Creatinine Equation (2021)    Anion gap 01/26/2021 12  5 - 15 Final   Performed at [facility], [address] [place], [place] [address]   Alcohol, Ethyl (B) 01/26/2021 <10  <10 mg/dL Final   Comment: (NOTE) Lowest detectable limit for serum alcohol is 10 mg/dL.  For medical purposes only. Performed at [facility], [address], [place], [place] [address]    WBC 01/26/2021 10.8 (A) 4.0 - 10.5 K/uL Final  RBC 01/26/2021 5.14  4.22 - 5.81 MIL/uL Final   Hemoglobin  01/26/2021 14.7  13.0 - 17.0 g/dL Final   HCT 67/89/3810 44.4  39.0 - 52.0 % Final   MCV 01/26/2021 86.4  80.0 - 100.0 fL Final   MCH 01/26/2021 28.6  26.0 - 34.0 pg Final   MCHC 01/26/2021 33.1  30.0 - 36.0 g/dL Final   RDW 17/51/0258 13.3  11.5 - 15.5 % Final   Platelets 01/26/2021 332  150 - 400 K/uL Final   nRBC 01/26/2021 0.0  0.0 - 0.2 % Final   Performed at [facility], [address], [place], [place] [address]   Opiates 01/26/2021 NONE DETECTED  NONE DETECTED Final   Cocaine 01/26/2021 POSITIVE (A) NONE DETECTED Final   Benzodiazepines 01/26/2021 NONE DETECTED  NONE DETECTED Final   Amphetamines 01/26/2021 POSITIVE (A) NONE DETECTED Final   Tetrahydrocannabinol 01/26/2021 POSITIVE (A) NONE DETECTED Final   Barbiturates 01/26/2021 NONE DETECTED  NONE DETECTED Final   Comment: (NOTE) DRUG SCREEN FOR MEDICAL PURPOSES ONLY.  IF CONFIRMATION IS NEEDED FOR ANY PURPOSE, NOTIFY LAB WITHIN 5 DAYS.  LOWEST DETECTABLE LIMITS FOR URINE DRUG SCREEN Drug Class                     Cutoff (ng/mL) Amphetamine and metabolites    1000 Barbiturate and metabolites    200 Benzodiazepine                 200 Tricyclics and metabolites     300 Opiates and metabolites        300 Cocaine and metabolites        300 THC                            50 Performed at [facility], [address] [place], [place] [address]    SARS Coronavirus 2 by RT PCR 01/26/2021 NEGATIVE  NEGATIVE Final   Comment: (NOTE) SARS-CoV-2 target nucleic acids are NOT DETECTED.  The SARS-CoV-2 RNA is generally detectable in upper respiratory specimens during the acute phase of infection. The lowest concentration of SARS-CoV-2 viral copies this assay can detect is 138 copies/mL. A negative result does not preclude SARS-Cov-2 infection and should not be used as the sole basis for treatment or other patient management decisions. A negative result may occur with  improper specimen collection/handling,  submission of specimen other than nasopharyngeal swab, presence of viral mutation(s) within the areas targeted by this assay, and inadequate number of viral copies(<138 copies/mL). A negative result must be combined with clinical observations, patient history, and epidemiological information. The expected result is Negative.  Fact Sheet for Patients:  BloggerCourse.com  Fact Sheet for Healthcare Providers:  SeriousBroker.it  This test is no                          t yet approved or cleared by the [place] FDA and  has been authorized for detection and/or diagnosis of SARS-CoV-2 by FDA under an Emergency Use Authorization (EUA). This EUA will remain  in effect (meaning this test can be used) for the duration of the COVID-19 declaration under Section 564(b)(1) of the Act, 21 U.S.C.section 360bbb-3(b)(1), unless the authorization is terminated  or revoked sooner.       Influenza A by PCR 01/26/2021 NEGATIVE  NEGATIVE Final   Influenza B by PCR 01/26/2021 NEGATIVE  NEGATIVE Final   Comment: (NOTE) The Xpert Xpress SARS-CoV-2/FLU/RSV plus assay is intended as an aid in the diagnosis of influenza from Nasopharyngeal swab specimens and should not be used as a sole basis for treatment. Nasal washings and aspirates are unacceptable for Xpert Xpress SARS-CoV-2/FLU/RSV testing.  Fact Sheet for Patients: BloggerCourse.com  Fact Sheet for Healthcare Providers: SeriousBroker.it  This test is not yet approved or cleared by the [place] FDA and has been authorized for detection and/or diagnosis of SARS-CoV-2 by FDA under an Emergency Use Authorization (EUA). This EUA will remain in effect (meaning this test can be used) for the duration of the COVID-19 declaration under Section 564(b)(1) of the Act, 21 U.S.C. section 360bbb-3(b)(1), unless the authorization is terminated  or revoked.  Performed at [facility], [address] [place], [place] [address]     Blood Alcohol level:  Lab Results  Component Value Date   ETH <10 07/21/2021   ETH <10 07/20/2021    Metabolic Disorder Labs: Lab Results  Component Value Date   HGBA1C 5.5 07/23/2021   MPG 111.15 07/23/2021   No results found for: PROLACTIN Lab Results  Component Value Date   CHOL 142 07/23/2021   TRIG 135 07/23/2021   HDL 53 07/23/2021   CHOLHDL 2.7 07/23/2021   VLDL 27 07/23/2021   LDLCALC 62 07/23/2021    Therapeutic Lab Levels: Lab Results  Component Value Date   LITHIUM <0.06 (L) 07/23/2021   No results found for: VALPROATE No components found for:  CBMZ  Physical Findings   PHQ2-9    Flowsheet Row ED from 07/21/2021 in [facility] EMERGENCY DEPARTMENT  PHQ-2 Total Score 3  PHQ-9 Total Score 10      Flowsheet Row ED from 07/23/2021 in [facility] ED from 07/21/2021 in [facility] EMERGENCY DEPARTMENT ED from 07/20/2021 in [facility] EMERGENCY DEPARTMENT  C-SSRS RISK CATEGORY No Risk Moderate Risk No Risk        Musculoskeletal  Strength & Muscle Tone: within normal limits Gait & Station: normal Patient leans: N/A  Psychiatric Specialty Exam  Presentation  General Appearance: Appropriate for Environment  Eye Contact:Fleeting  Speech:Clear and Coherent; Normal Rate  Speech Volume:Normal  Handedness:Right   Mood and Affect  Mood:Dysphoric  Affect:Congruent   Thought Process  Thought Processes:Coherent  Descriptions of Associations:Intact  Orientation:Full (Time, Place and Person)  Thought Content:Logical  Diagnosis of Schizophrenia or Schizoaffective disorder in past: No    Hallucinations:Hallucinations: None  Ideas of Reference:None  Suicidal Thoughts:Suicidal Thoughts: No  Homicidal Thoughts:Homicidal Thoughts: No   Sensorium  Memory:Immediate  Good; Recent Good; Remote Good  Judgment:Fair  Insight:Fair   Executive Functions  Concentration:Fair  Attention Span:Fair  Recall:Good  Fund of Knowledge:Good  Language:Good   Psychomotor Activity  Psychomotor Activity:Psychomotor Activity: Normal   Assets  Assets:Communication Skills; Desire for Improvement; Physical Health; Resilience; Social Support   Sleep  Sleep:Sleep: Fair Number of Hours of Sleep: 6   No data recorded  Physical Exam  Physical Exam Vitals and nursing note reviewed.  Constitutional:      Appearance: He is well-developed.  HENT:     Head: Normocephalic and atraumatic.  Eyes:     General:        Right eye: No discharge.        Left eye: No discharge.     Conjunctiva/sclera: Conjunctivae normal.  Cardiovascular:     Rate and Rhythm: Normal rate.  Pulmonary:     Effort: Pulmonary effort is normal. No respiratory distress.  Musculoskeletal:        General: Normal range of motion.     Cervical back: Normal range of motion.  Skin:    Coloration: Skin is not jaundiced or pale.  Neurological:     Mental Status: He is alert and oriented to person, place, and time.  Psychiatric:        Attention and Perception: Attention and perception normal.        Mood and Affect: Mood is depressed.        Speech: Speech normal.        Behavior: Behavior is cooperative.        Thought Content: Thought content normal.        Cognition and Memory: Cognition normal.        Judgment: Judgment is impulsive.   Review of Systems  Constitutional: Negative.   HENT: Negative.    Eyes: Negative.   Respiratory: Negative.    Cardiovascular: Negative.   Gastrointestinal: Negative.   Genitourinary: Negative.   Skin: Negative.   Psychiatric/Behavioral:  Positive for depression.   Blood pressure 105/66, pulse 64, temperature (!) 97.5 F (36.4 C), temperature source Temporal, resp. rate 16, SpO2 100 %. There is no height or weight on file to calculate  BMI.  Treatment Plan Summary: Daily contact with patient to assess and evaluate symptoms and progress in treatment and Medication management.  Patient continues to meet criteria for treatment in the [facility].  Chart and lab work reviewed  Disposition ongoing: possible D/C in am.   Ardis Hughs, NP 07/25/2021 11:27 AM

## 2021-07-25 NOTE — ED Notes (Signed)
Pt given snack. 

## 2021-07-25 NOTE — ED Notes (Signed)
Pt asleep in bed. Respirations even and unlabored. Will continue to monitor for safety. ?

## 2021-07-25 NOTE — Progress Notes (Signed)
Patient resting in bed.  Respirations even and unlabored. Continue to monitor for safety. 

## 2021-07-25 NOTE — Progress Notes (Signed)
Patient eating breakfast and watching television.  Dressed appropriately in own clothing.  Medication compliant.  Stated his girlfriend is calling half-way houses and he's hoping to discharge to one of them.  Denied SI, HI, AVH.

## 2021-07-25 NOTE — ED Notes (Signed)
Pt sitting in dining room watching TV. A&O x4, calm and cooperative. Pt denies current SI/HI/AVH. Pt denies any immediate needs. No signs of acute distress noted. Will continue to monitor for safety. 

## 2021-07-25 NOTE — Progress Notes (Signed)
Patient on phone repeatedly throughout day.  Remains medication compliant.  Denies any complaints.

## 2021-07-25 NOTE — ED Notes (Signed)
Pt sitting in dining room watching TV. Pt A&O x4, calm and cooperative. Pt denies current SI/HI/AVH. Pt denies any immediate needs. No signs of acute distress noted. Will continue to monitor for safety.

## 2021-07-26 DIAGNOSIS — Z79899 Other long term (current) drug therapy: Secondary | ICD-10-CM | POA: Diagnosis not present

## 2021-07-26 DIAGNOSIS — F1994 Other psychoactive substance use, unspecified with psychoactive substance-induced mood disorder: Secondary | ICD-10-CM | POA: Diagnosis not present

## 2021-07-26 MED ORDER — LITHIUM CARBONATE 150 MG PO CAPS: 150.0000 mg | ORAL_CAPSULE | Freq: Two times a day (BID) | ORAL | 0 refills | Status: DC

## 2021-07-26 MED ORDER — FLUOXETINE HCL 20 MG PO CAPS: 20.0000 mg | ORAL_CAPSULE | Freq: Every day | ORAL | 0 refills | Status: DC

## 2021-07-26 MED ORDER — TRAZODONE HCL 50 MG PO TABS: 50.0000 mg | ORAL_TABLET | Freq: Every evening | ORAL | 0 refills | Status: DC | PRN

## 2021-07-26 NOTE — Clinical Social Work Psych Note (Signed)
CSW Note  Monday-07/25/2021  CSW met with patient for introduction and to discuss potential discharge planning.    Jonathan Montgomery shared that he and his girlfriend had begun the process of seeking him housing at a halfway house. Jonathan Montgomery reported that he planned on discharging Tuesday with his girlfriend. He denied any substance use currently. Jonathan Montgomery's UDS was negative this encounter.   Jonathan Montgomery reports he plans to continue his care by following up with [facility] for medication management and therapy services.   Tuesday - 07/26/2021  Patient is discharging with girlfriend per Carolyn Coleman, NP. CSW will chart follow up providers in the patient's AVS. There were no additional concerns at this time.         , MSW, LCSW Clinical Social Worker (Facility Based Crisis) [facility]   

## 2021-07-26 NOTE — Progress Notes (Signed)
Provider in to talk with patient this AM and then patient out for breakfast.  Medication compliant.  Using phone to call girlfriend.  Stated girlfriend will be here around 9 or 9:30 AM to transport home.  Provider aware.

## 2021-07-26 NOTE — ED Provider Notes (Signed)
[facility]BC/OBS ASAP Discharge Summary  Date and Time: 07/26/2021 7:52 AM  Name: Jonathan Montgomery  MRN:  [id number]   Discharge Diagnoses:  Final diagnoses:  Substance induced mood disorder [facility])    Subjective:  Jonathan Montgomery, 30 y.o., male patient admitted to [facility] for mood stabilization and safety on 07/23/2021.  Patient seen face to face by this provider, Dr. Lucianne Muss; and  chart reviewed on 07/27/21.  On evaluation Jonathan Montgomery reports he is ready to be discharged.  Reports his medications are working and his depression has improved.  During evaluation Jonathan Montgomery is laying in his bed.  He sits up for the assessment.  He is fairly groomed and makes good eye contact. He/Jonathan is alert/oriented x 4; and cooperative.  He continues to endorse some anxiety and depression, but reports they are improved.  His affect is dysphoric.  Patient is speaking in a clear tone at moderate volume, and normal pace.  His thought process is coherent and relevant; There is no indication that he is currently responding to internal/external stimuli or experiencing delusional thought content.  Patient denies suicidal/self-harm/homicidal ideation, psychosis, and paranoia.  Patient contracts for safety.  Denies access to firearms/weapons.  Patient reports his girlfriend will pick him up upon discharge.  States he has several leads for openings in sober living of [place] that he plans to pursue.  At this time he denies any withdrawal symptoms and denies any medical concerns.  Continues to report no adverse reactions to medications  Stay Summary: Jonathan Montgomery was admitted [facility] for mood stabilization and safety.  Jonathan Montgomery was discharged with current medication and was instructed on how to take medications as prescribed; (details listed below under Medication List).  He was given a 1 week prescription for lithium 150 mg p.o. twice daily and Prozac 20 mg p.o. daily.  He was instructed to do open access walk-in hours for [facility]  behavioral health outpatient services.  He also was provided with an appointment for October 31.  Jonathan Montgomery's improvement was monitored by continuous assessment/observation and his report of symptom reduction.  His emotional and mental status was also monitored by staff.            Jonathan Montgomery was evaluated for stability and plans for continued recovery upon discharge. The following was addressed as part of his discharge planning and follow up treatment:  Employment, housing, transportation, bed availability, health status, family support, and any pending legal issues were also considered during his during his admission.  He was offered further treatment options upon discharge including but not limited to Residential, Intensive Outpatient, Outpatient treatment, Rehabilitation services, and resources for shelters and [facility]way-house if needed.  Patient declined.  Jonathan Montgomery will follow up with the services as listed below under Follow up Information.     Upon completion of this admission the Jonathan Montgomery was both mentally and medically stable for discharge denying suicidal/homicidal ideation, auditory/visual/tactile hallucinations, delusional thoughts and paranoia.     Total Time spent with patient: 30 minutes  Past Psychiatric History: History of bipolar 2 Past Medical History:  Past Medical History:  Diagnosis Date   ADHD    Bipolar 1 disorder (HCC)    Borderline personality disorder (HCC)    GERD (gastroesophageal reflux disease)     Past Surgical History:  Procedure Laterality Date   OPEN REDUCTION INTERNAL FIXATION (ORIF) DISTAL RADIAL FRACTURE Right 02/05/2021   Procedure: OPEN REDUCTION INTERNAL FIXATION (ORIF) DISTAL RADIAL FRACTURE and Irrigation & debridement of open  fracture;  Surgeon: Betha Loa, MD;  Location: [facility] OR;  Service: Orthopedics;  Laterality: Right;   Family History: No family history on file. Family Psychiatric History: Unknown Social History:  Social  History   Substance and Sexual Activity  Alcohol Use None     Social History   Substance and Sexual Activity  Drug Use Not on file    Social History   Socioeconomic History   Marital status: Single    Spouse name: Not on file   Number of children: Not on file   Years of education: Not on file   Highest education level: Not on file  Occupational History   Not on file  Tobacco Use   Smoking status: Never   Smokeless tobacco: Never  Substance and Sexual Activity   Alcohol use: Not on file   Drug use: Not on file   Sexual activity: Not on file  Other Topics Concern   Not on file  Social History Narrative   Not on file   Social Determinants of Health   Financial Resource Strain: Not on file  Food Insecurity: Not on file  Transportation Needs: Not on file  Physical Activity: Not on file  Stress: Not on file  Social Connections: Not on file   SDOH:  SDOH Screenings   Alcohol Screen: Not on file  Depression (PHQ2-9): Medium Risk   PHQ-2 Score: 10  Financial Resource Strain: Not on file  Food Insecurity: Not on file  Housing: Not on file  Physical Activity: Not on file  Social Connections: Not on file  Stress: Not on file  Tobacco Use: Low Risk    Smoking Tobacco Use: Never   Smokeless Tobacco Use: Never  Transportation Needs: Not on file    Tobacco Cessation:  N/A, patient does not currently use tobacco products  Current Medications:  Current Facility-Administered Medications  Medication Dose Route Frequency Provider Last Rate Last Admin   acetaminophen (TYLENOL) tablet 650 mg  650 mg Oral Q6H PRN White, Patrice L, NP       alum & mag hydroxide-simeth (MAALOX/MYLANTA) 200-200-20 MG/5ML suspension 30 mL  30 mL Oral Q4H PRN White, Patrice L, NP       cloNIDine (CATAPRES) tablet 0.1 mg  0.1 mg Oral Q6H PRN White, Patrice L, NP       FLUoxetine (PROZAC) capsule 20 mg  20 mg Oral Daily White, Patrice L, NP   20 mg at 07/25/21 0920   hydrOXYzine (ATARAX/VISTARIL)  tablet 25 mg  25 mg Oral TID PRN Liborio Nixon L, NP   25 mg at 07/25/21 2103   lithium carbonate capsule 150 mg  150 mg Oral BID WC White, Patrice L, NP   150 mg at 07/25/21 1643   loperamide (IMODIUM) capsule 2-4 mg  2-4 mg Oral PRN White, Patrice L, NP       LORazepam (ATIVAN) tablet 1 mg  1 mg Oral Q6H PRN White, Patrice L, NP       magnesium hydroxide (MILK OF MAGNESIA) suspension 30 mL  30 mL Oral Daily PRN White, Patrice L, NP       multivitamin with minerals tablet 1 tablet  1 tablet Oral Daily White, Patrice L, NP   1 tablet at 07/25/21 0920   ondansetron (ZOFRAN-ODT) disintegrating tablet 4 mg  4 mg Oral Q6H PRN White, Patrice L, NP       thiamine tablet 100 mg  100 mg Oral Daily White, Patrice L, NP   100 mg  at 07/25/21 0920   traZODone (DESYREL) tablet 50 mg  50 mg Oral QHS PRN White, Patrice L, NP   50 mg at 07/25/21 2102   Current Outpatient Medications  Medication Sig Dispense Refill   FLUoxetine (PROZAC) 20 MG capsule Take 20 mg by mouth daily.     HYDROcodone-acetaminophen (NORCO) 5-325 MG tablet 1-2 tabs po q6 hours prn pain (Patient not taking: Reported on 07/22/2021) 15 tablet 0   lithium carbonate 150 MG capsule Take 150 mg by mouth 2 (two) times daily with a meal.     sulfamethoxazole-trimethoprim (BACTRIM DS) 800-160 MG tablet Take 1 tablet by mouth 2 (two) times daily. (Patient not taking: Reported on 07/22/2021) 14 tablet 0   traZODone (DESYREL) 50 MG tablet Take 50 mg by mouth at bedtime.      PTA Medications: (Not in a hospital admission)   Musculoskeletal  Strength & Muscle Tone: within normal limits Gait & Station: normal Patient leans: N/A  Psychiatric Specialty Exam  Presentation  General Appearance: Appropriate for Environment  Eye Contact:Good  Speech:Clear and Coherent  Speech Volume:Normal  Handedness:Right   Mood and Affect  Mood:Dysphoric  Affect:Congruent   Thought Process  Thought Processes:Coherent  Descriptions of  Associations:Intact  Orientation:Full (Time, Place and Person)  Thought Content:Logical  Diagnosis of Schizophrenia or Schizoaffective disorder in past: No    Hallucinations:Hallucinations: None  Ideas of Reference:None  Suicidal Thoughts:Suicidal Thoughts: No  Homicidal Thoughts:Homicidal Thoughts: No   Sensorium  Memory:Immediate Good; Recent Good; Remote Good  Judgment:Fair  Insight:Fair   Executive Functions  Concentration:Good  Attention Span:Good  Recall:Good  Fund of Knowledge:Good  Language:Good   Psychomotor Activity  Psychomotor Activity:Psychomotor Activity: Normal   Assets  Assets:Communication Skills; Desire for Improvement; Leisure Time; Physical Health; Resilience; Social Support   Sleep  Sleep:Sleep: Good Number of Hours of Sleep: 8   No data recorded  Physical Exam  Physical Exam Vitals and nursing note reviewed.  Constitutional:      Appearance: He is well-developed.  HENT:     Head: Normocephalic and atraumatic.  Eyes:     General:        Right eye: No discharge.        Left eye: No discharge.     Conjunctiva/sclera: Conjunctivae normal.  Cardiovascular:     Rate and Rhythm: Normal rate.     Heart sounds: No murmur heard. Pulmonary:     Effort: Pulmonary effort is normal. No respiratory distress.  Musculoskeletal:        General: Normal range of motion.     Cervical back: Normal range of motion.  Skin:    General: Skin is warm and dry.  Neurological:     Mental Status: He is alert and oriented to person, place, and time.  Psychiatric:        Attention and Perception: Attention and perception normal.        Mood and Affect: Mood is depressed.        Speech: Speech normal.        Behavior: Behavior normal. Behavior is cooperative.        Thought Content: Thought content normal.        Cognition and Memory: Cognition normal.        Judgment: Judgment is impulsive.   Review of Systems  Constitutional: Negative.    HENT: Negative.    Eyes: Negative.   Respiratory: Negative.    Cardiovascular: Negative.   Musculoskeletal: Negative.   Skin: Negative.  Neurological: Negative.   Psychiatric/Behavioral:  Positive for depression.   Blood pressure 99/68, pulse 78, temperature (!) 97.3 F (36.3 C), temperature source Tympanic, resp. rate 18, SpO2 99 %. There is no height or weight on file to calculate BMI.  Demographic Factors:  Male, Adolescent or young adult, Low socioeconomic status, and Unemployed  Loss Factors: Financial problems/change in socioeconomic status  Historical Factors: Impulsivity  Risk Reduction Factors:   Sense of responsibility to family, Positive social support, Positive therapeutic relationship, and Positive coping skills or problem solving skills  Continued Clinical Symptoms:  Severe Anxiety and/or Agitation Bipolar Disorder:   Bipolar II Depressive phase Depression:   Comorbid alcohol abuse/dependence Impulsivity Alcohol/Substance Abuse/Dependencies  Cognitive Features That Contribute To Risk:  None    Suicide Risk:  Minimal: No identifiable suicidal ideation.  Patients presenting with no risk factors but with morbid ruminations; may be classified as minimal risk based on the severity of the depressive symptoms  Plan Of Care/Follow-up recommendations:  Activity:  as tolerated  Diet:  regular  Disposition:   Discharge patient  Provided resources for [place] community health and wellness, therapeutic alternatives and [facility] behavioral health outpatient services including open access walk-in hours for medication management.  An appointment was made for patient by this provider for August 22, 2021 at 2:30 PM.  But patient was advised to do open access walk-in hours this week.  Patient was only provided a 1 week prescription for lithium 150 mg p.o. twice daily and Prozac 20 mg p.o. daily.  No evidence of imminent risk to self or others at present.     Patient does not meet criteria for psychiatric inpatient admission. Discussed crisis plan, support from social network, calling 911, coming to the Emergency Department, and calling Suicide Hotline.   Ardis Hughs, NP 07/26/2021, 7:52 AM

## 2021-07-26 NOTE — Discharge Instructions (Addendum)

## 2021-07-26 NOTE — Progress Notes (Signed)
AVS reviewed with patient and all questions answered.  Patient verbalized understanding of information presented.  Patient denied SI, HI, AVH.  Stated that he felt safe and ready for discharge.  Patient ambulated independently to lobby without issue.  All belongings returned from locker 11.  Patient discharged in stable condition; no acute distress noted.
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
## 2021-07-26 NOTE — ED Notes (Signed)
Pt asleep in bed. Respirations even and unlabored. Will continue to monitor for safety. ?

## 2021-08-01 ENCOUNTER — Telehealth (HOSPITAL_COMMUNITY): Payer: Self-pay

## 2021-08-01 NOTE — BH Assessment (Signed)
Care Management - Follow Up [facility] Discharges   Writer attempted to make contact with patient today and was unsuccessful.  Writer left a HIPPA compliant voice message.   

## 2021-08-20 IMAGING — CT CT CHEST W/ CM
2 of 5 series · 13 of 46 positions shown, 15 images · IV contrast (agent unspecified)
Comparison: None.

CLINICAL DATA: Fall from third story.  Drug use.

EXAM:
CT HEAD WITHOUT CONTRAST
CT MAXILLOFACIAL WITHOUT CONTRAST
CT CERVICAL SPINE WITHOUT CONTRAST
CT CHEST, ABDOMEN AND PELVIS WITH CONTRAST
TECHNIQUE: Contiguous axial images were obtained from the base of the skull
through the vertex without intravenous contrast.

[Series 3: cap with · axial · 0.91mm/px · z∈[-893,-288]mm · 10 of 144 slices shown, 12 images]
[im 12/144  soft-tissue]
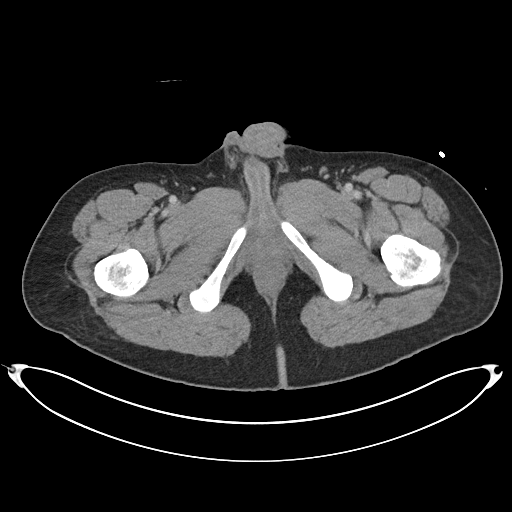
[im 12/144  bone]
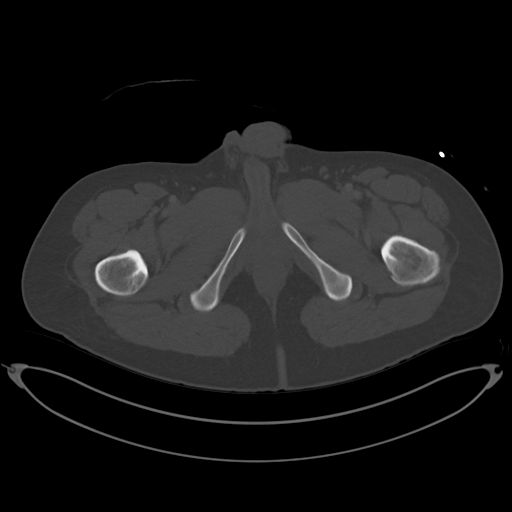
[im 23/144  soft-tissue]
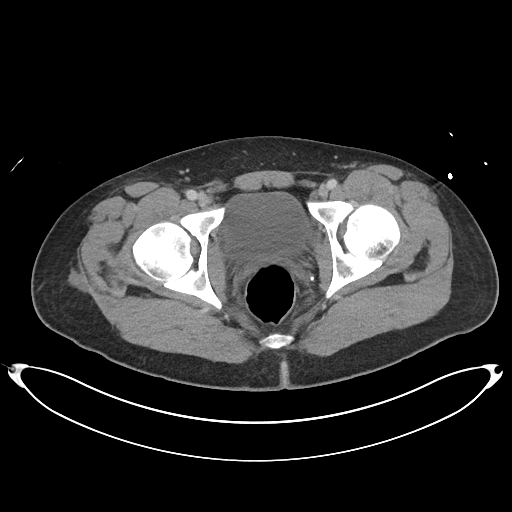
[im 45/144  soft-tissue]
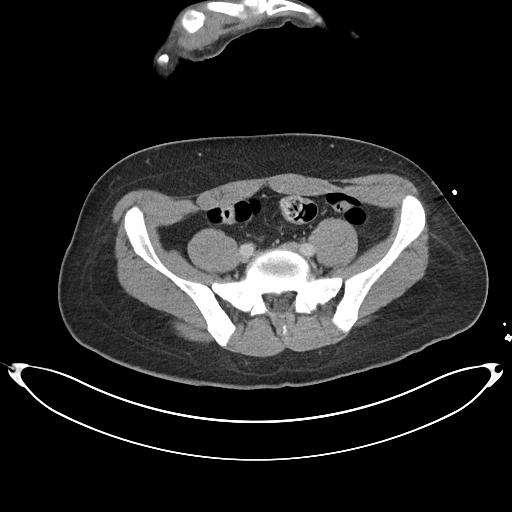
[im 56/144  soft-tissue]
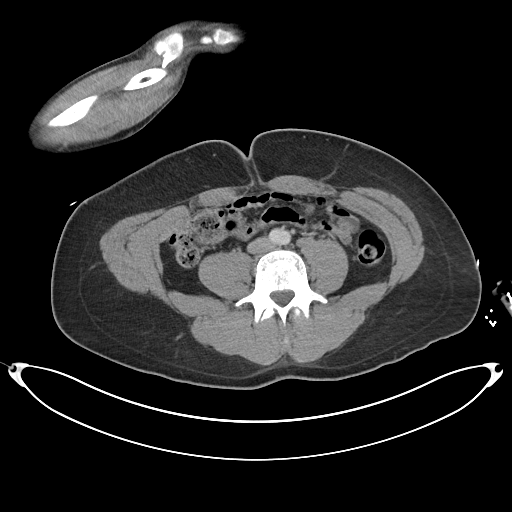
[im 67/144  soft-tissue]
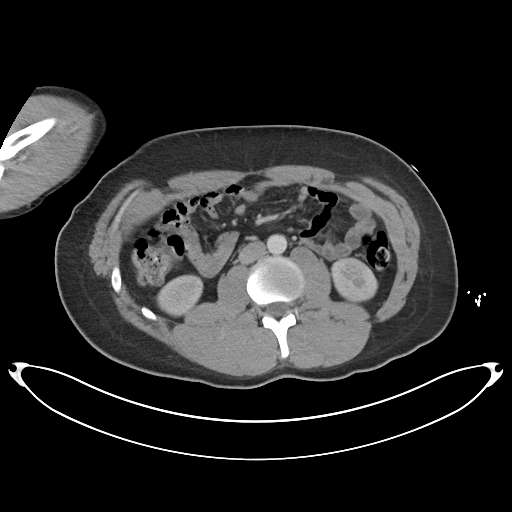
[im 78/144  soft-tissue]
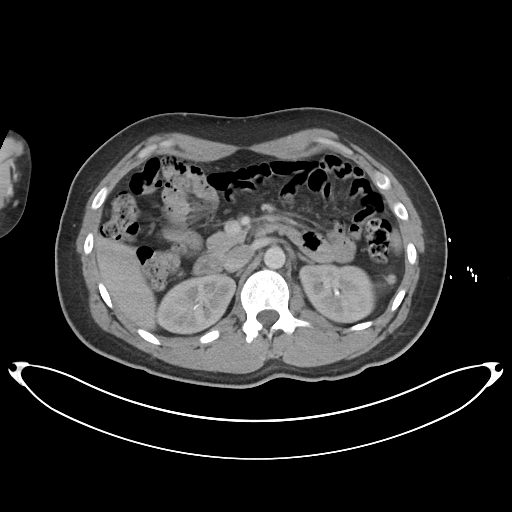
[im 89/144  soft-tissue]
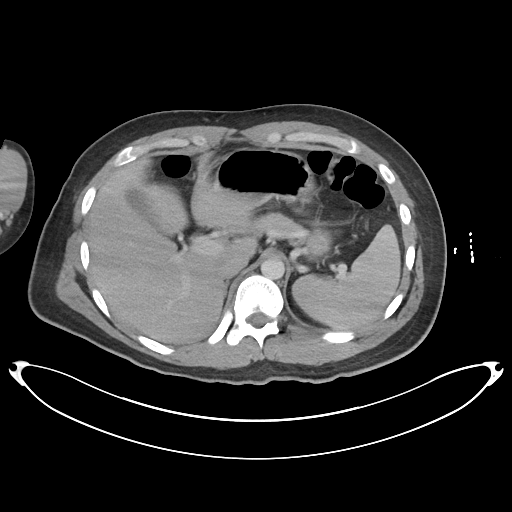
[im 111/144  soft-tissue]
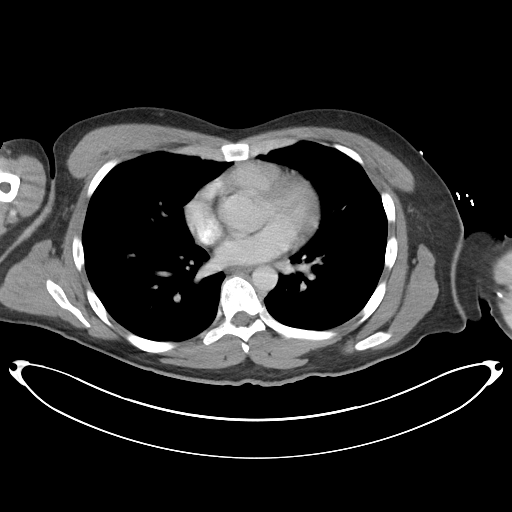
[im 122/144  soft-tissue]
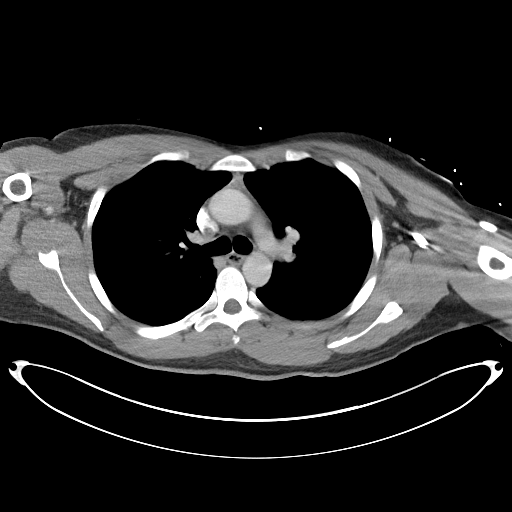
[im 122/144  bone]
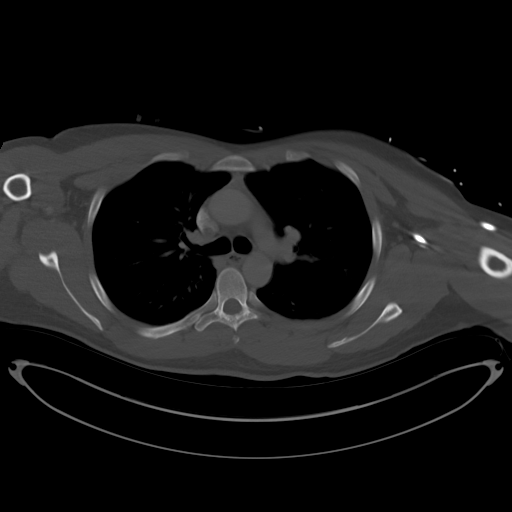
[im 133/144  soft-tissue]
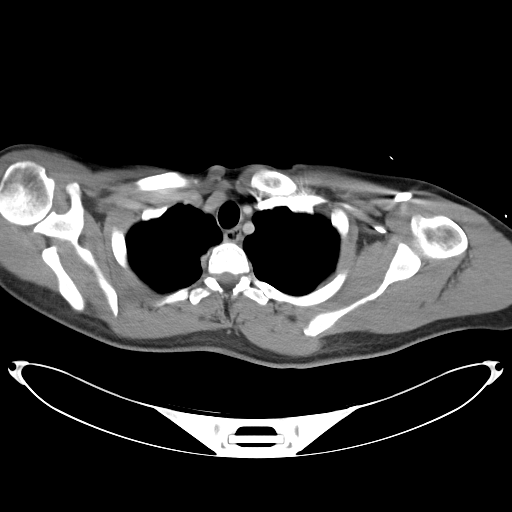

[Series 6: cor · coronal · 0.92mm/px · 3 of 83 slices shown]
[im 28/83  soft-tissue]
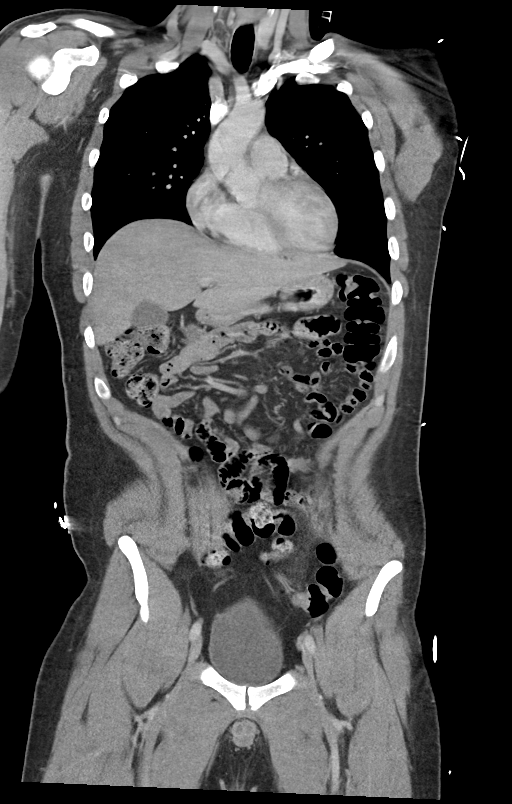
[im 37/83  soft-tissue]
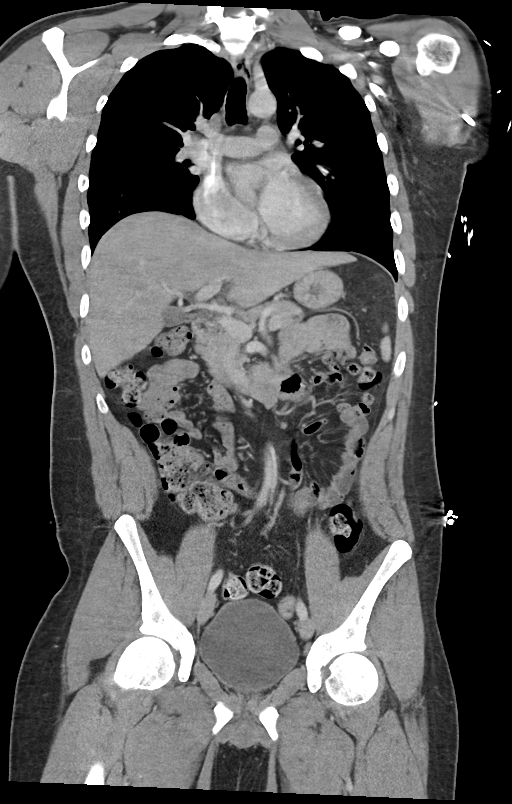
[im 46/83  soft-tissue]
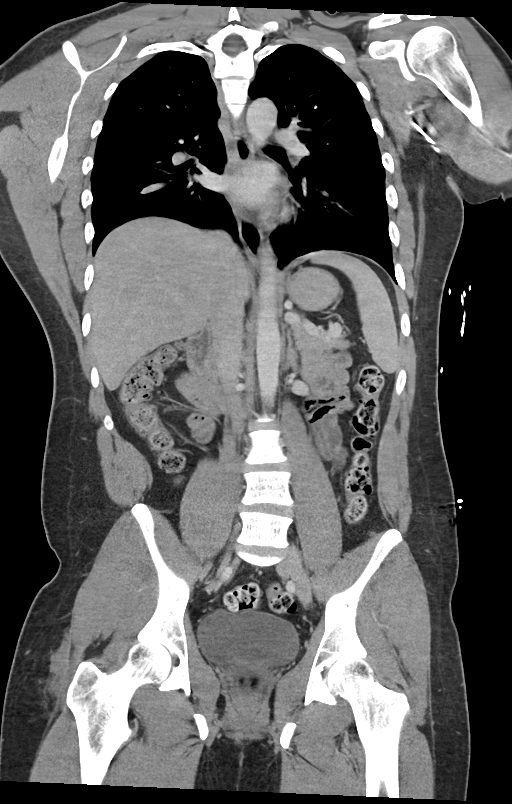

[13 of 46 positions shown; findings below may reference images not displayed]

Multidetector CT imaging of the maxillofacial structures was
performed. Multiplanar CT image reconstructions were also generated.
A small metallic BB was placed on the right temple in order to
reliably differentiate right from left.

Multidetector CT imaging of the cervical spine was performed without
intravenous contrast. Multiplanar CT image reconstructions were also
generated.

Multidetector CT imaging of the chest, abdomen and pelvis was
performed following the standard protocol during bolus
administration of intravenous contrast.

CONTRAST:  100mL OMNIPAQUE IOHEXOL 300 MG/ML  SOLN
FINDINGS: CT HEAD FINDINGS

Brain:

No evidence of large-territorial acute infarction. No parenchymal
hemorrhage. No mass lesion. No extra-axial collection.

No mass effect or midline shift. No hydrocephalus. Basilar cisterns
are patent.

Vascular: No hyperdense vessel.

Skull: No acute fracture or focal lesion.

Other: None.

CT MAXILLOFACIAL FINDINGS

Osseous: Comminuted bilateral nasal bone fracture with deviation to
the left. No other acute displaced facial fracture. No destructive
process.

Sinuses/Orbits: Paranasal sinuses and mastoid air cells are clear.
The orbits are unremarkable.

Soft tissues: Negative.

CT CERVICAL SPINE FINDINGS

Alignment: Normal.

Skull base and vertebrae: Bifid spinous processes. No acute
fracture. No aggressive appearing focal osseous lesion or focal
pathologic process.

Soft tissues and spinal canal: No prevertebral fluid or swelling. No
visible canal hematoma.

Upper chest: Unremarkable.

Other: None.

CT CHEST FINDINGS
Ports and Devices: None.

Lungs/airways:

3.8 cm area of hypodensity of the left hepatic lobe along the
falciform ligament ([DATE], [DATE]). Subcentimeter hypodensity
posterior to this ([DATE]). No pulmonary nodule. No pulmonary mass. No
pulmonary contusion or laceration. No pneumatocele formation.

The central airways are patent.

Pleura: No pleural effusion. No pneumothorax. No hemothorax.

Lymph Nodes: No mediastinal, hilar, or axillary lymphadenopathy.

Mediastinum:

No pneumomediastinum. No aortic injury or mediastinal hematoma.

The thoracic aorta is normal in caliber. The heart is normal in
size. No significant pericardial effusion.

The esophagus is unremarkable.

The thyroid is unremarkable.

Chest Wall / Breasts: No chest wall mass.

Musculoskeletal: No acute rib or sternal fracture. No spinal
fracture.

CT ABDOMEN AND PELVIS FINDINGS

Liver: Not enlarged. No focal lesion. No laceration or subcapsular
hematoma.

Biliary System: The gallbladder is otherwise unremarkable with no
radio-opaque gallstones. No biliary ductal dilatation.

Pancreas: Normal pancreatic contour. No main pancreatic duct
dilatation.

Spleen: Not enlarged. No focal lesion. No laceration, subcapsular
hematoma, or vascular injury.

Adrenal Glands: No nodularity bilaterally.

Kidneys:

Bilateral kidneys enhance symmetrically. No hydronephrosis. No
contusion, laceration, or subcapsular hematoma.

No injury to the vascular structures or collecting systems. No
hydroureter.

The urinary bladder is unremarkable.

Bowel: No small or large bowel wall thickening or dilatation. The
appendix is unremarkable.

Mesentery, Omentum, and Peritoneum: No simple free fluid ascites. No
pneumoperitoneum. No hemoperitoneum. No mesenteric hematoma
identified. No organized fluid collection.

Pelvic Organs: Normal.

Lymph Nodes: No abdominal, pelvic, inguinal lymphadenopathy.

Vasculature: No abdominal aorta or iliac aneurysm. No active
contrast extravasation or pseudoaneurysm.

Musculoskeletal:

Subcutaneus soft tissue edema with small hematoma formation of the
right hip ([DATE]).

No acute pelvic fracture. No spinal fracture.
IMPRESSION: 1. No acute intracranial abnormality.
2. Nasal bone fracture.
3. No acute displaced fracture or traumatic listhesis of the
cervical spine.
4. A 3.8 cm left hepatic lobe hypodensity along the falciform
ligament likely represents focal fatty infiltration. Differential
diagnosis includes less likely hepatic traumatic injury. Correlate
with physical exam.
5. Otherwise no acute traumatic injury to the chest, abdomen, or
pelvis.

6. No acute fracture or traumatic malalignment of the thoracic or
lumbar spine.

These results were called by telephone at the time of interpretation
on 02/05/2021 at [DATE] to provider NOMASIBULELE MOATSHE , who verbally
acknowledged these results.

## 2021-08-22 ENCOUNTER — Ambulatory Visit (HOSPITAL_COMMUNITY): Payer: No Payment, Other | Admitting: Student in an Organized Health Care Education/Training Program

## 2021-10-14 ENCOUNTER — Ambulatory Visit (HOSPITAL_COMMUNITY)
Admission: EM | Admit: 2021-10-14 | Discharge: 2021-10-15 | Disposition: A | Discharge status: Home / Self Care | Payer: Medicaid - Out of State | Attending: Psychiatry | Admitting: Psychiatry

## 2021-10-14 DIAGNOSIS — Z9152 Personal history of nonsuicidal self-harm: Secondary | ICD-10-CM | POA: Insufficient documentation

## 2021-10-14 DIAGNOSIS — R45851 Suicidal ideations: Secondary | ICD-10-CM

## 2021-10-14 DIAGNOSIS — Z20822 Contact with and (suspected) exposure to covid-19: Secondary | ICD-10-CM | POA: Insufficient documentation

## 2021-10-14 DIAGNOSIS — F19959 Other psychoactive substance use, unspecified with psychoactive substance-induced psychotic disorder, unspecified: Secondary | ICD-10-CM

## 2021-10-14 DIAGNOSIS — F603 Borderline personality disorder: Secondary | ICD-10-CM | POA: Insufficient documentation

## 2021-10-14 DIAGNOSIS — Z9151 Personal history of suicidal behavior: Secondary | ICD-10-CM | POA: Insufficient documentation

## 2021-10-14 DIAGNOSIS — F3110 Bipolar disorder, current episode manic without psychotic features, unspecified: Secondary | ICD-10-CM

## 2021-10-14 LAB — LIPID PANEL
Cholesterol: 140 mg/dL (ref 0–200)
HDL: 62 mg/dL (ref >40)
LDL Cholesterol: 73 mg/dL (ref 0–99)
Total CHOL/HDL Ratio: 2.3 RATIO
Triglycerides: 27 mg/dL (ref <150)
VLDL: 5 mg/dL (ref 0–40)

## 2021-10-14 LAB — COMPREHENSIVE METABOLIC PANEL
ALT: 22 U/L (ref 0–44)
AST: 24 U/L (ref 15–41)
Albumin: 4.3 g/dL (ref 3.5–5.0)
Alkaline Phosphatase: 83 U/L (ref 38–126)
Anion gap: 12 (ref 5–15)
BUN: 16 mg/dL (ref 6–20)
CO2: 26 mmol/L (ref 22–32)
Calcium: 10.1 mg/dL (ref 8.9–10.3)
Chloride: 101 mmol/L (ref 98–111)
Creatinine, Ser: 1.16 mg/dL (ref 0.61–1.24)
GFR, Estimated: >60 mL/min (ref >60)
Glucose, Bld: 84 mg/dL (ref 70–99)
Potassium: 3.2 mmol/L — ABNORMAL LOW (ref 3.5–5.1)
Sodium: 139 mmol/L (ref 135–145)
Total Bilirubin: 1 mg/dL (ref 0.3–1.2)
Total Protein: 8.3 g/dL — ABNORMAL HIGH (ref 6.5–8.1)

## 2021-10-14 LAB — RESP PANEL BY RT-PCR (FLU A&B, COVID) ARPGX2
Influenza A by PCR: NEGATIVE
Influenza B by PCR: NEGATIVE
SARS Coronavirus 2 by RT PCR: NEGATIVE

## 2021-10-14 LAB — CBC WITH DIFFERENTIAL/PLATELET
Abs Immature Granulocytes: 0.03 10*3/uL (ref 0.00–0.07)
Basophils Absolute: 0 10*3/uL (ref 0.0–0.1)
Basophils Relative: 0 %
Eosinophils Absolute: 0 10*3/uL (ref 0.0–0.5)
Eosinophils Relative: 0 %
HCT: 41.8 % (ref 39.0–52.0)
Hemoglobin: 13.8 g/dL (ref 13.0–17.0)
Immature Granulocytes: 0 %
Lymphocytes Relative: 23 %
Lymphs Abs: 2 10*3/uL (ref 0.7–4.0)
MCH: 26.6 pg (ref 26.0–34.0)
MCHC: 33 g/dL (ref 30.0–36.0)
MCV: 80.7 fL (ref 80.0–100.0)
Monocytes Absolute: 0.5 10*3/uL (ref 0.1–1.0)
Monocytes Relative: 6 %
Neutro Abs: 6 10*3/uL (ref 1.7–7.7)
Neutrophils Relative %: 71 %
Platelets: 401 10*3/uL — ABNORMAL HIGH (ref 150–400)
RBC: 5.18 MIL/uL (ref 4.22–5.81)
RDW: 14.1 % (ref 11.5–15.5)
WBC: 8.5 10*3/uL (ref 4.0–10.5)
nRBC: 0 % (ref 0.0–0.2)

## 2021-10-14 LAB — HEPATITIS PANEL, ACUTE
HCV Ab: NONREACTIVE
Hep A IgM: NONREACTIVE
Hep B C IgM: NONREACTIVE
Hepatitis B Surface Ag: NONREACTIVE

## 2021-10-14 LAB — POC SARS CORONAVIRUS 2 AG: SARSCOV2ONAVIRUS 2 AG: NEGATIVE

## 2021-10-14 LAB — TSH: TSH: 0.511 u[IU]/mL (ref 0.350–4.500)

## 2021-10-14 LAB — LITHIUM LEVEL: Lithium Lvl: <0.06 mmol/L — ABNORMAL LOW (ref 0.60–1.20)

## 2021-10-14 MED ORDER — LITHIUM CARBONATE 300 MG PO CAPS
300.0000 mg | ORAL_CAPSULE | Freq: Two times a day (BID) | ORAL | Status: DC
  Administered 2021-10-14: 22:00:00 300 mg via ORAL
  Filled 2021-10-14: qty 1

## 2021-10-14 MED ORDER — HYDROXYZINE HCL 50 MG PO TABS
50.0000 mg | ORAL_TABLET | Freq: Three times a day (TID) | ORAL | Status: DC | PRN
  Administered 2021-10-14 – 2021-10-15 (×2): 50 mg via ORAL
  Filled 2021-10-14: qty 10
  Filled 2021-10-14: qty 2
  Filled 2021-10-14: qty 10
  Filled 2021-10-14: qty 2

## 2021-10-14 MED ORDER — ACETAMINOPHEN 325 MG PO TABS: 650.0000 mg | ORAL_TABLET | Freq: Four times a day (QID) | ORAL | Status: DC | PRN

## 2021-10-14 MED ORDER — MAGNESIUM HYDROXIDE 400 MG/5ML PO SUSP: 30.0000 mL | Freq: Every day | ORAL | Status: DC | PRN

## 2021-10-14 MED ORDER — POTASSIUM CHLORIDE CRYS ER 20 MEQ PO TBCR
20.0000 meq | EXTENDED_RELEASE_TABLET | Freq: Two times a day (BID) | ORAL | Status: DC
  Administered 2021-10-14: 16:00:00 20 meq via ORAL
  Filled 2021-10-14: qty 1

## 2021-10-14 MED ORDER — AMLODIPINE BESYLATE 5 MG PO TABS
5.0000 mg | ORAL_TABLET | Freq: Every day | ORAL | Status: DC
  Administered 2021-10-14: 16:00:00 5 mg via ORAL
  Filled 2021-10-14: qty 1
  Filled 2021-10-14: qty 7

## 2021-10-14 MED ORDER — TRAZODONE HCL 50 MG PO TABS
50.0000 mg | ORAL_TABLET | Freq: Every evening | ORAL | Status: DC | PRN
  Filled 2021-10-14: qty 1

## 2021-10-14 MED ORDER — ALUM & MAG HYDROXIDE-SIMETH 200-200-20 MG/5ML PO SUSP: 30.0000 mL | ORAL | Status: DC | PRN

## 2021-10-14 NOTE — Progress Notes (Addendum)
Blood labs drawn, patient could not urinate at this time. Waiting for transport to pick up labs. Patient informed RN he was here because " I use meth and cocaine and I am coming down." I feel so cold and hungry whenever I am coming down off it." Patient then asked if he could lay back down after labs were collected.

## 2021-10-14 NOTE — Progress Notes (Signed)
Patient received scheduled medications as well as hydroxyzine due to restlessness and anxiety.

## 2021-10-14 NOTE — Progress Notes (Signed)
Patient oriented to unit, received sandwich and  pretzels and apple juice. No objective signs of acute distress.

## 2021-10-14 NOTE — BH Assessment (Addendum)
Pt  (Urgent), presents this date with his girlfriend, Charnley High, [phone].  Pt reports SI with a plan to jump from a bridge. Pt denies HI or AVH.  Pt admits to using both methamphetamines and heroine four or five hours ago. Pt admits to prior MH diagnosis or prescribed medication for symptom management  MSE signed by patient.

## 2021-10-14 NOTE — ED Notes (Addendum)
Pt A&O x 4, awake & resting at present.  No distress noted.  Monitoring for safety.
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
## 2021-10-14 NOTE — Discharge Instructions (Addendum)
Take all medications as prescribed. Keep all follow-up appointments as scheduled.  Do not consume alcohol or use illegal drugs while on prescription medications. Report any adverse effects from your medications to your primary care provider promptly.  In the event of recurrent symptoms or worsening symptoms, call 911, a crisis hotline, or go to the nearest emergency department for evaluation.   

## 2021-10-14 NOTE — ED Notes (Signed)
Pt resting at present, restless.  No distress noted.  Monitoring for safety.

## 2021-10-14 NOTE — Progress Notes (Signed)
Patient laying down, respirations are unlabored patient seems restless intermittently shaking his head and legs while eyes still closed. Nursing staff will continue to monitor patient.

## 2021-10-14 NOTE — ED Provider Notes (Signed)
Behavioral Health Admission H&P [facility] & OBS)  Date: 10/14/21 Jonathan Montgomery Name: Jonathan Montgomery MRN: [id number] Chief Complaint:  Chief Complaint  Jonathan Montgomery Montgomery with   Suicidal      Diagnoses:  Final diagnoses:  Bipolar I disorder, most recent episode (or current) manic (HCC)  Substance-induced psychotic disorder (HCC)  Suicidal intent    HPI: Jonathan Montgomery is a 30 year old male presenting to [facility] with complaints of "I had a back week". Jonathan Montgomery reports suicidal ideations with intent to cut Jonathan Montgomery wrists. Jonathan Montgomery reports a history of cutting beginning at the age of 30 years old. Jonathan Montgomery showed healed scars on forearm during the visit. Jonathan Montgomery has a history of prior suicide attempt x 1 of which Jonathan Montgomery jumped from a four story hotel building in April. Jonathan Montgomery reports methamphetamine use with last use 4-5 hours ago. Jonathan Montgomery has a psychiatric history of Bipolar 1 Disorder, ADHD, Borderline Personality Disorder, and Substance induced disorders.  Today Jonathan Montgomery manic, hyper verbal, and paranoid with disheveled appearance. Jonathan Montgomery is wearing mismatched shoes. Jonathan Montgomery reports that Jonathan Montgomery thought Jonathan Montgomery was "too high at first". Jonathan Montgomery believes that someone is looking in the windows or is at Jonathan Montgomery door of Jonathan Montgomery home. Jonathan Montgomery girlfriend accompanied Jonathan Montgomery to today's visit and Jonathan Montgomery shared that Jonathan Montgomery girlfriend has told Jonathan Montgomery that there is no one at the doors. Jonathan Montgomery reported that the police arrived to Jonathan Montgomery home and took Jonathan Montgomery girlfriend out. At this time, Jonathan Montgomery reported Jonathan Montgomery threatened to kill himself if Jonathan Montgomery girlfriend was not brought back into the home. Jonathan Montgomery reports that Jonathan Montgomery continues to have  SI despite having Jonathan Montgomery girlfriend back safe. Jonathan Montgomery states that Jonathan Montgomery was previously prescribed Lithium but has not taken Jonathan Montgomery medication since Jonathan Montgomery was last here in October. Jonathan Montgomery's girlfriend reported that Jonathan Montgomery has been awake for four days using methamphetamine and heroin.   PHQ 2-9:  Flowsheet Row ED from 07/21/2021 in [facility] EMERGENCY DEPARTMENT   Thoughts that you would be better off dead, or of hurting yourself in some way Several days  PHQ-9 Total Score 10       Flowsheet Row ED from 10/14/2021 in [facility] ED from 07/23/2021 in [facility] ED from 07/21/2021 in [facility] EMERGENCY DEPARTMENT  C-SSRS RISK CATEGORY Low Risk No Risk Moderate Risk        Total Time spent with Jonathan Montgomery: 30 minutes  Musculoskeletal  Strength & Muscle Tone: within normal limits Gait & Station: normal, unsteady Jonathan Montgomery leans: N/A  Psychiatric Specialty Exam  Presentation General Appearance: Disheveled  Eye Contact:Fair  Speech:Clear and Coherent; Other (comment) (rapid)  Speech Volume:Normal  Handedness:Right   Mood and Affect  Mood:Euphoric  Affect:Congruent   Thought Process  Thought Processes:Disorganized  Descriptions of Associations:Tangential  Orientation:Full (Time, Place and Person)  Thought Content:Paranoid Ideation  Diagnosis of Schizophrenia or Schizoaffective disorder in past: No   Hallucinations:Hallucinations: None  Ideas of Reference:None  Suicidal Thoughts:Suicidal Thoughts: Yes, Active SI Active Intent and/or Plan: With Intent; With Plan  Homicidal Thoughts:Homicidal Thoughts: No   Sensorium  Memory:Immediate Good; Recent Good; Remote Good  Judgment:Poor  Insight:Fair   Executive Functions  Concentration:Fair  Attention Span:Fair  Recall:Good  Fund of Knowledge:Good  Language:Good   Psychomotor Activity  Psychomotor Activity:Psychomotor Activity: Normal   Assets  Assets:Communication Skills; Desire for Improvement; Housing; Social Support   Sleep  Sleep:Sleep: Poor Number of Hours of Sleep: 0   Nutritional Assessment (For OBS and FBC admissions only) Has  the Jonathan Montgomery had a weight loss or gain of 10 pounds or more in the last 3 months?: No Has the Jonathan Montgomery had a decrease in food intake/or appetite?:  No Does the Jonathan Montgomery have dental problems?: No Does the Jonathan Montgomery have eating habits or behaviors that may be indicators of an eating disorder including binging or inducing vomiting?: No Has the Jonathan Montgomery recently lost weight without trying?: 0 Has the Jonathan Montgomery been eating poorly because of a decreased appetite?: 0 Malnutrition Screening Tool Score: 0    Physical Exam Vitals reviewed.  Pulmonary:     Effort: Pulmonary effort is normal.  Skin:    General: Skin is warm and dry.  Neurological:     General: No focal deficit present.     Mental Status: Jonathan Montgomery is alert and oriented to person, place, and time.  Psychiatric:        Attention and Perception: Jonathan Montgomery is inattentive.        Mood and Affect: Mood is elated.        Speech: Speech is rapid and pressured.        Behavior: Behavior is hyperactive. Behavior is cooperative.        Thought Content: Thought content is paranoid and delusional. Thought content includes suicidal ideation. Thought content does not include homicidal ideation. Thought content includes suicidal plan. Thought content does not include homicidal plan.        Cognition and Memory: Cognition and memory normal.        Judgment: Judgment is inappropriate.   Review of Systems  Psychiatric/Behavioral:  Positive for hallucinations, substance abuse and suicidal ideas. The Jonathan Montgomery is nervous/anxious and has insomnia.   All other systems reviewed and are negative.  Blood pressure (!) 147/101, pulse (!) 112, temperature 97.7 F (36.5 C), resp. rate 18, SpO2 100 %. There is no height or weight on file to calculate BMI.  Past Psychiatric History: ADHD, Bipolar 1 disorder, substance induced disorders, Borderline Personality Disorder   Is the Jonathan Montgomery at risk to self? Yes  Has the Jonathan Montgomery been a risk to self in the past 6 months? Yes .    Has the Jonathan Montgomery been a risk to self within the distant past? Yes   Is the Jonathan Montgomery a risk to others? No   Has the Jonathan Montgomery been a risk to others in the  past 6 months? No   Has the Jonathan Montgomery been a risk to others within the distant past? No   Past Medical History:  Past Medical History:  Diagnosis Date   ADHD    Bipolar 1 disorder (HCC)    Borderline personality disorder (HCC)    GERD (gastroesophageal reflux disease)     Past Surgical History:  Procedure Laterality Date   OPEN REDUCTION INTERNAL FIXATION (ORIF) DISTAL RADIAL FRACTURE Right 02/05/2021   Procedure: OPEN REDUCTION INTERNAL FIXATION (ORIF) DISTAL RADIAL FRACTURE and Irrigation & debridement of open fracture;  Surgeon: Betha Loa, MD;  Location: [facility] OR;  Service: Orthopedics;  Laterality: Right;    Family History: No family history on file.  Social History:  Social History   Socioeconomic History   Marital status: Single    Spouse name: Not on file   Number of children: Not on file   Years of education: Not on file   Highest education level: Not on file  Occupational History   Not on file  Tobacco Use   Smoking status: Never   Smokeless tobacco: Never  Substance and Sexual Activity   Alcohol use:  Not on file   Drug use: Not on file   Sexual activity: Not on file  Other Topics Concern   Not on file  Social History Narrative   Not on file   Social Determinants of Health   Financial Resource Strain: Not on file  Food Insecurity: Not on file  Transportation Needs: Not on file  Physical Activity: Not on file  Stress: Not on file  Social Connections: Not on file  Intimate Partner Violence: Not on file    SDOH:  SDOH Screenings   Alcohol Screen: Not on file  Depression (PHQ2-9): Medium Risk   PHQ-2 Score: 10  Financial Resource Strain: Not on file  Food Insecurity: Not on file  Housing: Not on file  Physical Activity: Not on file  Social Connections: Not on file  Stress: Not on file  Tobacco Use: Low Risk    Smoking Tobacco Use: Never   Smokeless Tobacco Use: Never   Passive Exposure: Not on file  Transportation Needs: Not on file    Last  Labs:  Admission on 10/14/2021  Component Date Value Ref Range Status   SARSCOV2ONAVIRUS 2 AG 10/14/2021 NEGATIVE  NEGATIVE Final   Comment: (NOTE) SARS-CoV-2 antigen NOT DETECTED.   Negative results are presumptive.  Negative results do not preclude SARS-CoV-2 infection and should not be used as the sole basis for treatment or other Jonathan Montgomery management decisions, including infection  control decisions, particularly in the presence of clinical signs and  symptoms consistent with COVID-19, or in those who have been in contact with the virus.  Negative results must be combined with clinical observations, Jonathan Montgomery history, and epidemiological information. The expected result is Negative.  Fact Sheet for Patients: https://www.jennings-kim.com/  Fact Sheet for Healthcare Providers: https://alexander-rogers.biz/  This test is not yet approved or cleared by the [place] FDA and  has been authorized for detection and/or diagnosis of SARS-CoV-2 by FDA under an Emergency Use Authorization (EUA).  This EUA will remain in effect (meaning this test can be used) for the duration of  the COV                          ID-19 declaration under Section 564(b)(1) of the Act, 21 U.S.C. section 360bbb-3(b)(1), unless the authorization is terminated or revoked sooner.    Admission on 07/23/2021, Discharged on 07/26/2021  Component Date Value Ref Range Status   Hgb A1c MFr Bld 07/23/2021 5.5  4.8 - 5.6 % Final   Comment: (NOTE) Pre diabetes:          5.7%-6.4%  Diabetes:              >6.4%  Glycemic control for   <7.0% adults with diabetes    Mean Plasma Glucose 07/23/2021 111.15  mg/dL Final   Performed at [facility], [address] [place], [place] [address]   Cholesterol 07/23/2021 142  0 - 200 mg/dL Final   Triglycerides 90/38/3338 135  <150 mg/dL Final   HDL 32/91/9166 53  >40 mg/dL Final   Total CHOL/HDL Ratio 07/23/2021 2.7  RATIO Final   VLDL  07/23/2021 27  0 - 40 mg/dL Final   LDL Cholesterol 07/23/2021 62  0 - 99 mg/dL Final   Comment:        Total Cholesterol/HDL:CHD Risk Coronary Heart Disease Risk Table                     Men  Women  1/2 Average Risk   3.4   3.3  Average Risk       5.0   4.4  2 X Average Risk   9.6   7.1  3 X Average Risk  23.4   11.0        Use the calculated Jonathan Montgomery Ratio above and the CHD Risk Table to determine the Jonathan Montgomery's CHD Risk.        ATP III CLASSIFICATION (LDL):  <100     mg/dL   Optimal  093-267  mg/dL   Near or Above                    Optimal  130-159  mg/dL   Borderline  124-580  mg/dL   High  >998     mg/dL   Very High Performed at [facility], [address] [place], [place] [address]    TSH 07/23/2021 4.007  0.350 - 4.500 uIU/mL Final   Comment: Performed by a 3rd Generation assay with a functional sensitivity of <=0.01 uIU/mL. Performed at [facility], [address], [place], [place] [address]    Lithium Lvl 07/23/2021 <0.06 (L)  0.60 - 1.20 mmol/L Final   Performed at [facility], [address], [place], [place] [address]  Admission on 07/21/2021, Discharged on 07/23/2021  Component Date Value Ref Range Status   Sodium 07/21/2021 137  135 - 145 mmol/L Final   Potassium 07/21/2021 3.8  3.5 - 5.1 mmol/L Final   Chloride 07/21/2021 100  98 - 111 mmol/L Final   CO2 07/21/2021 28  22 - 32 mmol/L Final   Glucose, Bld 07/21/2021 98  70 - 99 mg/dL Final   Glucose reference range applies only to samples taken after fasting for at least 8 hours.   BUN 07/21/2021 8  6 - 20 mg/dL Final   Creatinine, Ser 07/21/2021 1.11  0.61 - 1.24 mg/dL Final   Calcium 93/79/0240 9.3  8.9 - 10.3 mg/dL Final   Total Protein 97/35/3299 6.9  6.5 - 8.1 g/dL Final   Albumin 24/26/8341 4.1  3.5 - 5.0 g/dL Final   AST 96/22/2979 72 (H)  15 - 41 U/L Final   ALT 07/21/2021 43  0 - 44 U/L Final   Alkaline Phosphatase 07/21/2021 73  38 - 126 U/L Final   Total Bilirubin  07/21/2021 1.5 (H)  0.3 - 1.2 mg/dL Final   GFR, Estimated 07/21/2021 >60  >60 mL/min Final   Comment: (NOTE) Calculated using the CKD-EPI Creatinine Equation (2021)    Anion gap 07/21/2021 9  5 - 15 Final   Performed at [facility], [address], [place], [place] [address]   Alcohol, Ethyl (B) 07/21/2021 <10  <10 mg/dL Final   Comment: (NOTE) Lowest detectable limit for serum alcohol is 10 mg/dL.  For medical purposes only. Performed at [facility], [address], [place], [place] [address]    WBC 07/21/2021 7.1  4.0 - 10.5 K/uL Final   RBC 07/21/2021 5.31  4.22 - 5.81 MIL/uL Final   Hemoglobin 07/21/2021 14.4  13.0 - 17.0 g/dL Final   HCT 06/05/4817 45.5  39.0 - 52.0 % Final   MCV 07/21/2021 85.7  80.0 - 100.0 fL Final   MCH 07/21/2021 27.1  26.0 - 34.0 pg Final   MCHC 07/21/2021 31.6  30.0 - 36.0 g/dL Final   RDW 56/31/4970 14.3  11.5 - 15.5 % Final  Platelets 07/21/2021 247  150 - 400 K/uL Final   nRBC 07/21/2021 0.0  0.0 - 0.2 % Final   Performed at [facility], [address] [place], [place] [address]   Opiates 07/21/2021 NONE DETECTED  NONE DETECTED Final   Cocaine 07/21/2021 NONE DETECTED  NONE DETECTED Final   Benzodiazepines 07/21/2021 POSITIVE (A)  NONE DETECTED Final   Amphetamines 07/21/2021 POSITIVE (A)  NONE DETECTED Final   Tetrahydrocannabinol 07/21/2021 POSITIVE (A)  NONE DETECTED Final   Barbiturates 07/21/2021 NONE DETECTED  NONE DETECTED Final   Comment: (NOTE) DRUG SCREEN FOR MEDICAL PURPOSES ONLY.  IF CONFIRMATION IS NEEDED FOR ANY PURPOSE, NOTIFY LAB WITHIN 5 DAYS.  LOWEST DETECTABLE LIMITS FOR URINE DRUG SCREEN Drug Class                     Cutoff (ng/mL) Amphetamine and metabolites    1000 Barbiturate and metabolites    200 Benzodiazepine                 200 Tricyclics and metabolites     300 Opiates and metabolites        300 Cocaine and metabolites        300 THC                            50 Performed at [facility], [address] [place], [place] [address]    SARS Coronavirus 2 by RT PCR 07/23/2021 NEGATIVE  NEGATIVE Final   Comment: (NOTE) SARS-CoV-2 target nucleic acids are NOT DETECTED.  The SARS-CoV-2 RNA is generally detectable in upper respiratory specimens during the acute phase of infection. The lowest concentration of SARS-CoV-2 viral copies this assay can detect is 138 copies/mL. A negative result does not preclude SARS-Cov-2 infection and should not be used as the sole basis for treatment or other Jonathan Montgomery management decisions. A negative result may occur with  improper specimen collection/handling, submission of specimen other than nasopharyngeal swab, presence of viral mutation(s) within the areas targeted by this assay, and inadequate number of viral copies(<138 copies/mL). A negative result must be combined with clinical observations, Jonathan Montgomery history, and epidemiological information. The expected result is Negative.  Fact Sheet for Patients:  BloggerCourse.com  Fact Sheet for Healthcare Providers:  SeriousBroker.it  This test is no                          t yet approved or cleared by the [place] FDA and  has been authorized for detection and/or diagnosis of SARS-CoV-2 by FDA under an Emergency Use Authorization (EUA). This EUA will remain  in effect (meaning this test can be used) for the duration of the COVID-19 declaration under Section 564(b)(1) of the Act, 21 U.S.C.section 360bbb-3(b)(1), unless the authorization is terminated  or revoked sooner.       Influenza A by PCR 07/23/2021 NEGATIVE  NEGATIVE Final   Influenza B by PCR 07/23/2021 NEGATIVE  NEGATIVE Final   Comment: (NOTE) The Xpert Xpress SARS-CoV-2/FLU/RSV plus assay is intended as an aid in the diagnosis of influenza from Nasopharyngeal swab specimens and should not be used as a sole basis for treatment. Nasal washings and aspirates are  unacceptable for Xpert Xpress SARS-CoV-2/FLU/RSV testing.  Fact Sheet for Patients: BloggerCourse.com  Fact Sheet for Healthcare Providers: SeriousBroker.it  This test is not yet approved or cleared by the [place]  FDA and has been authorized for detection and/or diagnosis of SARS-CoV-2 by FDA under an Emergency Use Authorization (EUA). This EUA will remain in effect (meaning this test can be used) for the duration of the COVID-19 declaration under Section 564(b)(1) of the Act, 21 U.S.C. section 360bbb-3(b)(1), unless the authorization is terminated or revoked.  Performed at [facility], [address] [place], [place] [address]   Admission on 07/20/2021, Discharged on 07/21/2021  Component Date Value Ref Range Status   Glucose-Capillary 07/20/2021 94  70 - 99 mg/dL Final   Glucose reference range applies only to samples taken after fasting for at least 8 hours.   Comment 1 07/20/2021 Notify RN   Final   Comment 2 07/20/2021 Document in Chart   Final   Sodium 07/20/2021 135  135 - 145 mmol/L Final   Potassium 07/20/2021 3.8  3.5 - 5.1 mmol/L Final   Chloride 07/20/2021 100  98 - 111 mmol/L Final   CO2 07/20/2021 18 (L)  22 - 32 mmol/L Final   Glucose, Bld 07/20/2021 81  70 - 99 mg/dL Final   Glucose reference range applies only to samples taken after fasting for at least 8 hours.   BUN 07/20/2021 16  6 - 20 mg/dL Final   Creatinine, Ser 07/20/2021 1.34 (H)  0.61 - 1.24 mg/dL Final   Calcium 97/11/6376 9.7  8.9 - 10.3 mg/dL Final   Total Protein 58/85/0277 7.4  6.5 - 8.1 g/dL Final   Albumin 41/28/7867 4.8  3.5 - 5.0 g/dL Final   AST 67/20/9470 79 (H)  15 - 41 U/L Final   ALT 07/20/2021 38  0 - 44 U/L Final   Alkaline Phosphatase 07/20/2021 81  38 - 126 U/L Final   Total Bilirubin 07/20/2021 2.9 (H)  0.3 - 1.2 mg/dL Final   GFR, Estimated 07/20/2021 >60  >60 mL/min Final   Comment: (NOTE) Calculated using the  CKD-EPI Creatinine Equation (2021)    Anion gap 07/20/2021 17 (H)  5 - 15 Final   Performed at [facility], [address], [place], [place] [address]   Alcohol, Ethyl (B) 07/20/2021 <10  <10 mg/dL Final   Comment: (NOTE) Lowest detectable limit for serum alcohol is 10 mg/dL.  For medical purposes only. Performed at [facility], [address], [place], [place] [address]    Opiates 07/20/2021 NONE DETECTED  NONE DETECTED Final   Cocaine 07/20/2021 NONE DETECTED  NONE DETECTED Final   Benzodiazepines 07/20/2021 POSITIVE (A)  NONE DETECTED Final   Amphetamines 07/20/2021 POSITIVE (A)  NONE DETECTED Final   Tetrahydrocannabinol 07/20/2021 POSITIVE (A)  NONE DETECTED Final   Barbiturates 07/20/2021 NONE DETECTED  NONE DETECTED Final   Comment: (NOTE) DRUG SCREEN FOR MEDICAL PURPOSES ONLY.  IF CONFIRMATION IS NEEDED FOR ANY PURPOSE, NOTIFY LAB WITHIN 5 DAYS.  LOWEST DETECTABLE LIMITS FOR URINE DRUG SCREEN Drug Class                     Cutoff (ng/mL) Amphetamine and metabolites    1000 Barbiturate and metabolites    200 Benzodiazepine                 200 Tricyclics and metabolites     300 Opiates and metabolites        300 Cocaine and metabolites        300 THC  50 Performed at [facility], [address] [place], [place] [address]    Color, Urine 07/20/2021 YELLOW  YELLOW Final   APPearance 07/20/2021 HAZY (A)  CLEAR Final   Specific Gravity, Urine 07/20/2021 1.015  1.005 - 1.030 Final   pH 07/20/2021 5.0  5.0 - 8.0 Final   Glucose, UA 07/20/2021 NEGATIVE  NEGATIVE mg/dL Final   Hgb urine dipstick 07/20/2021 NEGATIVE  NEGATIVE Final   Bilirubin Urine 07/20/2021 NEGATIVE  NEGATIVE Final   Ketones, ur 07/20/2021 80 (A)  NEGATIVE mg/dL Final   Protein, ur 60/45/4098 NEGATIVE  NEGATIVE mg/dL Final   Nitrite 11/91/4782 NEGATIVE  NEGATIVE Final   Leukocytes,Ua 07/20/2021 NEGATIVE  NEGATIVE Final   Performed at [facility], [address], [place], [place] [address]   WBC 07/20/2021 10.9 (H)  4.0 - 10.5 K/uL Final   RBC 07/20/2021 5.55  4.22 - 5.81 MIL/uL Final   Hemoglobin 07/20/2021 15.2  13.0 - 17.0 g/dL Final   HCT 30/86/5784 47.2  39.0 - 52.0 % Final   MCV 07/20/2021 85.0  80.0 - 100.0 fL Final   MCH 07/20/2021 27.4  26.0 - 34.0 pg Final   MCHC 07/20/2021 32.2  30.0 - 36.0 g/dL Final   RDW 69/62/9528 14.6  11.5 - 15.5 % Final   Platelets 07/20/2021 268  150 - 400 K/uL Final   nRBC 07/20/2021 0.0  0.0 - 0.2 % Final   Neutrophils Relative % 07/20/2021 68  % Final   Neutro Abs 07/20/2021 7.4  1.7 - 7.7 K/uL Final   Lymphocytes Relative 07/20/2021 23  % Final   Lymphs Abs 07/20/2021 2.5  0.7 - 4.0 K/uL Final   Monocytes Relative 07/20/2021 8  % Final   Monocytes Absolute 07/20/2021 0.9  0.1 - 1.0 K/uL Final   Eosinophils Relative 07/20/2021 0  % Final   Eosinophils Absolute 07/20/2021 0.0  0.0 - 0.5 K/uL Final   Basophils Relative 07/20/2021 1  % Final   Basophils Absolute 07/20/2021 0.1  0.0 - 0.1 K/uL Final   Immature Granulocytes 07/20/2021 0  % Final   Abs Immature Granulocytes 07/20/2021 0.03  0.00 - 0.07 K/uL Final   Performed at [facility], [address] [place], [place] [address]   Sodium 07/20/2021 133 (L)  135 - 145 mmol/L Final   Potassium 07/20/2021 3.2 (L)  3.5 - 5.1 mmol/L Final   Chloride 07/20/2021 98  98 - 111 mmol/L Final   CO2 07/20/2021 21 (L)  22 - 32 mmol/L Final   Glucose, Bld 07/20/2021 76  70 - 99 mg/dL Final   Glucose reference range applies only to samples taken after fasting for at least 8 hours.   BUN 07/20/2021 14  6 - 20 mg/dL Final   Creatinine, Ser 07/20/2021 1.25 (H)  0.61 - 1.24 mg/dL Final   Calcium 40/07/2724 8.8 (L)  8.9 - 10.3 mg/dL Final   GFR, Estimated 07/20/2021 >60  >60 mL/min Final   Comment: (NOTE) Calculated using the CKD-EPI Creatinine Equation (2021)    Anion gap 07/20/2021 14  5 - 15 Final   Performed at [facility], [address], [place], [place] [address]   Lactic Acid, Venous 07/20/2021 1.0  0.5 - 1.9 mmol/L Final   Performed at [facility], [address], [place], [place] [address]   Glucose-Capillary 07/20/2021 76  70 - 99 mg/dL Final   Glucose reference range applies only to samples  taken after fasting for at least 8 hours.    Allergies: Jonathan Montgomery has no known allergies.  PTA Medications: (Not in a hospital admission)   Medical Decision Making  Corleone Biegler will admit inpatient to the observation unit to determine appropriate level of care.    Recommendations  Based on my evaluation the Jonathan Montgomery does not appear to have an emergency medical condition.  Mcneil Sober, NP 10/14/21  1:52 PM

## 2021-10-15 ENCOUNTER — Encounter (HOSPITAL_COMMUNITY): Payer: Self-pay | Admitting: Emergency Medicine

## 2021-10-15 ENCOUNTER — Emergency Department (HOSPITAL_COMMUNITY)
Admission: EM | Admit: 2021-10-15 | Discharge: 2021-10-15 | Disposition: A | Discharge status: Home / Self Care | Payer: Medicaid - Out of State | Attending: Emergency Medicine | Admitting: Emergency Medicine

## 2021-10-15 ENCOUNTER — Other Ambulatory Visit: Payer: Self-pay

## 2021-10-15 ENCOUNTER — Emergency Department (HOSPITAL_COMMUNITY): Payer: Medicaid - Out of State

## 2021-10-15 DIAGNOSIS — R197 Diarrhea, unspecified: Secondary | ICD-10-CM | POA: Insufficient documentation

## 2021-10-15 DIAGNOSIS — R0602 Shortness of breath: Secondary | ICD-10-CM | POA: Insufficient documentation

## 2021-10-15 DIAGNOSIS — R110 Nausea: Secondary | ICD-10-CM | POA: Diagnosis not present

## 2021-10-15 DIAGNOSIS — R6883 Chills (without fever): Secondary | ICD-10-CM | POA: Diagnosis not present

## 2021-10-15 DIAGNOSIS — R079 Chest pain, unspecified: Secondary | ICD-10-CM | POA: Insufficient documentation

## 2021-10-15 LAB — CBC
HCT: 46.6 % (ref 39.0–52.0)
Hemoglobin: 14.6 g/dL (ref 13.0–17.0)
MCH: 25.7 pg — ABNORMAL LOW (ref 26.0–34.0)
MCHC: 31.3 g/dL (ref 30.0–36.0)
MCV: 82.2 fL (ref 80.0–100.0)
Platelets: 367 10*3/uL (ref 150–400)
RBC: 5.67 MIL/uL (ref 4.22–5.81)
RDW: 14.2 % (ref 11.5–15.5)
WBC: 10.6 10*3/uL — ABNORMAL HIGH (ref 4.0–10.5)
nRBC: 0 % (ref 0.0–0.2)

## 2021-10-15 LAB — BASIC METABOLIC PANEL
Anion gap: 10 (ref 5–15)
BUN: 14 mg/dL (ref 6–20)
CO2: 25 mmol/L (ref 22–32)
Calcium: 10.1 mg/dL (ref 8.9–10.3)
Chloride: 106 mmol/L (ref 98–111)
Creatinine, Ser: 0.97 mg/dL (ref 0.61–1.24)
GFR, Estimated: >60 mL/min (ref >60)
Glucose, Bld: 102 mg/dL — ABNORMAL HIGH (ref 70–99)
Potassium: 4.9 mmol/L (ref 3.5–5.1)
Sodium: 141 mmol/L (ref 135–145)

## 2021-10-15 LAB — TROPONIN I (HIGH SENSITIVITY)
Troponin I (High Sensitivity): <2 ng/L (ref <18)
Troponin I (High Sensitivity): 3 ng/L (ref <18)

## 2021-10-15 LAB — RPR: RPR Ser Ql: NONREACTIVE

## 2021-10-15 MED ORDER — AMLODIPINE BESYLATE 5 MG PO TABS: 5.0000 mg | ORAL_TABLET | Freq: Every day | ORAL | 0 refills | Status: DC

## 2021-10-15 NOTE — ED Notes (Signed)
Pt sitting up in bed resting at present.  No distress noted.  Monitoring for safety.

## 2021-10-15 NOTE — ED Notes (Addendum)
Pt verbally aggressive w/ staff upon discharge. Pt refused VS. Pt would not allow this RN to complete discharge instructions d/t aggression and agitation d/t being discharge. Security walked pt out. Pt was provided w/ information for [employer].

## 2021-10-15 NOTE — ED Notes (Signed)
Patient received After Visit Summary with follow up instructions. Patient voiced understanding of medication prescription for blood pressure sent to [facility] at [address]. ALl belongings from [facility] returned.

## 2021-10-15 NOTE — Discharge Instructions (Addendum)
Your workup was negative in the ED. Information provided today for shelters. You may take over the counter 600 mg ibuprofen every 6 hours as needed for pain. Return to the ED if you are experiencing increasing/worsening chest pain, shortness of breath, or worsening symptoms.

## 2021-10-15 NOTE — ED Triage Notes (Addendum)
Pt to triage via [facility] from [facility].  Recently discharged from [facility]. Pt states he got into argument with them and was going to leave, decided to stay, and was discharged. 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  Diagnosed with hypertension.  States he was at [employer] and was walking to [place] to get Rx.  Reports intermittent exertional chest pain x 24 hours that is worse when walking.  Pt homeless.  Reports "labored breathing".

## 2021-10-15 NOTE — ED Provider Notes (Signed)
Emergency Medicine Provider Triage Evaluation Note  Jonathan Montgomery , a 30 y.o. male  was evaluated in triage.  Pt complains of chest pain. States that same began today when he went to [facility] to pick up blood pressure medication. Sharp in nature in center of chest, does not radiate and is worse with exertion. Some associated SOB. Hx of significant IV heroin and meth use, last use 48 hours ago. He is homeless  Review of Systems  Positive: Chest pain, shortness of breath Negative: N/v/d, fever, chills  Physical Exam  BP (!) 119/100 (BP Location: Left Arm)    Pulse 98    Temp 98.6 F (37 C) (Oral)    Resp 16    SpO2 98%  Gen:   Awake, no distress   Resp:  Normal effort  MSK:   Moves extremities without difficulty  Other:    Medical Decision Making  Medically screening exam initiated at 12:45 PM.  Appropriate orders placed.  Jonathan Montgomery was informed that the remainder of the evaluation will be completed by another provider, this initial triage assessment does not replace that evaluation, and the importance of remaining in the ED until their evaluation is complete.     Vear Clock 10/15/21 1249    Linwood Dibbles, MD 10/16/21 [phone]

## 2021-10-15 NOTE — ED Provider Notes (Signed)
[facility] EMERGENCY DEPARTMENT Provider Note   CSN: [id number] Arrival date & time: 10/15/21  1231     History Chief Complaint  Patient presents with   Chest Pain    Jonathan Montgomery is a 30 y.o. male who presents to the ED brought in by EMS complaining of chest pain onset yesterday.  Patient was at [facility] to pick up his blood pressure medication when he began to experience chest pain.  His chest pain is nonradiating, sharp, sternal, and worse with exertion.  Has associated shortness of breath, diarrhea, nausea, chills.  Has not tried new medications for his symptoms.  Denies fever, chills, nausea, vomiting, diarrhea, abdominal pain.  Denies prior history of MI, CAD.  Patient is a IV drug user with his last use of IV heroin 3 days ago and IV meth 4 hours ago.    The history is provided by the patient. No language interpreter was used.   HPI: A 30 year old patient presents for evaluation of chest pain. Initial onset of pain was more than 6 hours ago. The patient's chest pain is worse with exertion. The patient's chest pain is middle- or left-sided, is not well-localized, is not described as heaviness/pressure/tightness, is not sharp and does not radiate to the arms/jaw/neck. The patient does not complain of nausea and denies diaphoresis. The patient has no history of stroke, has no history of peripheral artery disease, has not smoked in the past 90 days, denies any history of treated diabetes, has no relevant family history of coronary artery disease (first degree relative at less than age 74), is not hypertensive, has no history of hypercholesterolemia and does not have an elevated BMI (>=30).   Past Medical History:  Diagnosis Date   ADHD    Bipolar 1 disorder (HCC)    Borderline personality disorder (HCC)    GERD (gastroesophageal reflux disease)     Patient Active Problem List   Diagnosis Date Noted   Psychoactive substance-induced mood disorder (HCC) 02/06/2021    Substance induced mood disorder (HCC) 01/27/2021    Past Surgical History:  Procedure Laterality Date   OPEN REDUCTION INTERNAL FIXATION (ORIF) DISTAL RADIAL FRACTURE Right 02/05/2021   Procedure: OPEN REDUCTION INTERNAL FIXATION (ORIF) DISTAL RADIAL FRACTURE and Irrigation & debridement of open fracture;  Surgeon: Betha Loa, MD;  Location: [facility] OR;  Service: Orthopedics;  Laterality: Right;       No family history on file.  Social History   Tobacco Use   Smoking status: Never   Smokeless tobacco: Never  Substance Use Topics   Alcohol use: Yes   Drug use: Yes    Types: Methamphetamines, IV    Comment: heroin    Home Medications Prior to Admission medications   Medication Sig Start Date End Date Taking? Authorizing Provider  amLODipine (NORVASC) 5 MG tablet Take 1 tablet (5 mg total) by mouth daily. 10/15/21   Oneta Rack, NP  FLUoxetine (PROZAC) 20 MG capsule Take 1 capsule (20 mg total) by mouth daily. 07/26/21   Ardis Hughs, NP  lithium carbonate 150 MG capsule Take 1 capsule (150 mg total) by mouth 2 (two) times daily with a meal. 07/26/21   Ardis Hughs, NP  traZODone (DESYREL) 50 MG tablet Take 1 tablet (50 mg total) by mouth at bedtime as needed for sleep. 07/26/21   Ardis Hughs, NP    Allergies    Patient has no known allergies.  Review of Systems   Review of Systems  Constitutional:  Positive for chills. Negative for fever.  Respiratory:  Positive for shortness of breath.   Cardiovascular:  Positive for chest pain.  Gastrointestinal:  Positive for diarrhea and nausea. Negative for abdominal pain and vomiting.  Musculoskeletal:  Negative for joint swelling.  Skin:  Negative for color change, rash and wound.  All other systems reviewed and are negative.  Physical Exam Updated Vital Signs BP (!) 119/100 (BP Location: Left Arm)    Pulse 98    Temp 98.6 F (37 C) (Oral)    Resp 16    Ht 5\' 11"  (1.803 m)    Wt 90.7 kg    SpO2 98%    BMI  27.89 kg/m   Physical Exam Vitals and nursing note reviewed.  Constitutional:      General: He is not in acute distress.    Appearance: He is not diaphoretic.  HENT:     Head: Normocephalic and atraumatic.     Mouth/Throat:     Pharynx: No oropharyngeal exudate.  Eyes:     General: No scleral icterus.    Conjunctiva/sclera: Conjunctivae normal.  Cardiovascular:     Rate and Rhythm: Normal rate and regular rhythm.     Pulses: Normal pulses.     Heart sounds: Normal heart sounds.  Pulmonary:     Effort: Pulmonary effort is normal. No respiratory distress.     Breath sounds: Normal breath sounds. No wheezing.  Chest:     Chest wall: No mass, lacerations, deformity, swelling or tenderness.     Comments: No chest wall tenderness to palpation.  No overlying skin changes. Abdominal:     General: Bowel sounds are normal.     Palpations: Abdomen is soft. There is no mass.     Tenderness: There is no abdominal tenderness. There is no guarding or rebound.  Musculoskeletal:        General: Normal range of motion.     Cervical back: Normal range of motion and neck supple.     Comments: No C, T, L, S spinal tenderness to palpation.  Skin:    General: Skin is warm and dry.  Neurological:     Mental Status: He is alert.  Psychiatric:        Behavior: Behavior normal.    ED Results / Procedures / Treatments   Labs (all labs ordered are listed, but only abnormal results are displayed) Labs Reviewed  BASIC METABOLIC PANEL - Abnormal; Notable for the following components:      Result Value   Glucose, Bld 102 (*)    All other components within normal limits  CBC - Abnormal; Notable for the following components:   WBC 10.6 (*)    MCH 25.7 (*)    All other components within normal limits  TROPONIN I (HIGH SENSITIVITY)  TROPONIN I (HIGH SENSITIVITY)    EKG EKG Interpretation  Date/Time:  Saturday October 15 2021 14:49:17 EST Ventricular Rate:  82 PR Interval:  162 QRS  Duration: 94 QT Interval:  392 QTC Calculation: 457 R Axis:   12 Text Interpretation: Normal sinus rhythm with sinus arrhythmia Normal ECG Since last tracing rate slower Confirmed by 05-29[birth year]1993 [phone]) on 10/15/2021 2:55:23 PM  Radiology DG Chest 2 View  Result Date: 10/15/2021 CLINICAL DATA:  chest pain EXAM: CHEST - 2 VIEW COMPARISON:  February 05, 2021 FINDINGS: The cardiomediastinal silhouette is normal in contour. No pleural effusion. No pneumothorax. No acute pleuroparenchymal abnormality. Visualized abdomen is unremarkable. IMPRESSION: No acute cardiopulmonary  abnormality. Electronically Signed   By: Meda Klinefelter M.D.   On: 10/15/2021 13:04    Procedures Procedures   Medications Ordered in ED Medications - No data to display  ED Course  I have reviewed the triage vital signs and the nursing notes.  Pertinent labs & imaging results that were available during my care of the patient were reviewed by me and considered in my medical decision making (see chart for details).  Clinical Course as of 10/15/21 1610  Sat Oct 15, 2021  1458 Attending aware of case, attending agreeable with discharge treatment plan. [SB]  1522 Notified by nurse prior to discharge that patient becoming verbally aggressive.  Patient evaluated and notified of negative work-up in the ED.  Provided patient with information for shelters, patient upset and stormed off with police escort. [SB]    Clinical Course User Index [SB] Faria Casella A, PA-C   MDM Rules/Calculators/A&P HEAR Score: 1                       Patient presents to the ED with chest pain since yesterday.  Patient brought in by EMS, patient was at [facility] pick up his medication when he experienced chest pain.  Patient is a IV drug user with his last use of IV meth 4 hours ago and IV heroin 3 days ago. No prior history of MI.  No prior catheterization. Differential diagnosis includes ACS, aortic dissection, pneumothorax, PE. On exam patient  without acute cardiovascular, respiratory, abdominal findings. Vital signs stable, patient not hypoxic or tachycardic. EKG without acute ST/T changes. Initial troponin negative.  CBC and BMP unremarkable.  Chest x-ray without acute intra cardiopulmonary findings.  Patient resting comfortably on stretcher.    EKG without acute ST/T changes, initial troponin negative, chest x-ray negative, low suspicion for ACS at this time. Heart score 1, low risk for ACS.  Chest x-ray without acute findings, vital signs stable, doubt aortic dissection or pneumothorax at this time.  No unilateral lower extremity swelling, malignancy, HRT, recent immobilizations or surgery.  Less likely PE at this time.  Case discussed with attending who agrees with discharge treatment plan.  Discussed discharge treatment plan with patient, patient agrees to be discharged at this time.  Patient notified of discharge and became verbally aggressive with staff as well as myself.  Discussed with patient and provided flyer for shelters.  Patient aggressive and storms off with security escort.  Patient to follow-up as indicated in discharge paperwork.   Final Clinical Impression(s) / ED Diagnoses Final diagnoses:  Chest pain, unspecified type    Rx / DC Orders ED Discharge Orders     None        Tirth Cothron A, PA-C 10/15/21 1610    Jacalyn Lefevre, MD 10/16/21 [phone]

## 2021-10-15 NOTE — ED Provider Notes (Addendum)
FBC/OBS ASAP Discharge Summary  Date and Time: 10/15/2021 8:14 AM  Name: Jonathan Montgomery  MRN:  [id number]   Discharge Diagnoses:  Final diagnoses:  Bipolar I disorder, most recent episode (or current) manic (HCC)  Substance-induced psychotic disorder (HCC)  Suicidal intent    Subjective:  Jonathan Montgomery was seen and evaluated face-to-face.  Stated " do you why I am here?  I am ready to leave."  He denies suicidal or homicidal ideations.  Denies auditory or visual hallucinations.  Jonathan Montgomery is requesting for his medication to be sent to [facility]. Case staffed with attending psychiatrist  [place] patient to discharge with outpatient resources.Support, encouragement and reassurance was provided.   Per initial admission assessment note patient presented manic hyperverbal and paranoid.  Chart reviewed UDS positive for amphetamines, benzodiazepines and marijuana.  No other safety concerns noted.  Patient to discharge with follow-up with [facility] urgent care facility.  Support, encouragement reassurance was provided.  Per admission assessment note: "Jonathan Montgomery is a 30 year old male presenting to [facility] with complaints of "I had a back week". Patient reports suicidal ideations with intent to cut his wrists. Patient reports a history of cutting beginning at the age of 30 years old. Patient showed healed scars on forearm during the visit. Patient has a history of prior suicide attempt x 1 of which he jumped from a four story hotel building in April. He reports methamphetamine use with last use 4-5 hours ago. He has a psychiatric history of Bipolar 1 Disorder, ADHD, Borderline Personality Disorder, and Substance induced disorders.  Today Jonathan Montgomery presents manic, hyper verbal, and paranoid with disheveled appearance. He is wearing mismatched shoes. He reports that he thought he was "too high at first". He believes that someone is looking in the windows or is at his door of his home. His girlfriend  accompanied him to today's visit and he shared that his girlfriend has told him that there is no one at the doors. Patient reported that the police arrived to his home and took his girlfriend out. At this time, patient reported he threatened to kill himself if his girlfriend was not brought back into the home. Patient reports that he continues to have  SI despite having his girlfriend back safe. He states that he was previously prescribed Lithium but has not taken his medication since he was last here in October. Patient's girlfriend reported that patient has been awake for four days using methamphetamine and heroin."    Stay Summary: Patient For overnight observation.   Total Time spent with patient: 15 minutes  Past Psychiatric History:  Past Medical History:  Past Medical History:  Diagnosis Date   ADHD    Bipolar 1 disorder (HCC)    Borderline personality disorder (HCC)    GERD (gastroesophageal reflux disease)     Past Surgical History:  Procedure Laterality Date   OPEN REDUCTION INTERNAL FIXATION (ORIF) DISTAL RADIAL FRACTURE Right 02/05/2021   Procedure: OPEN REDUCTION INTERNAL FIXATION (ORIF) DISTAL RADIAL FRACTURE and Irrigation & debridement of open fracture;  Surgeon: Betha Loa, MD;  Location: [facility] OR;  Service: Orthopedics;  Laterality: Right;   Family History: No family history on file. Family Psychiatric History:  Social History:  Social History   Substance and Sexual Activity  Alcohol Use None     Social History   Substance and Sexual Activity  Drug Use Not on file    Social History   Socioeconomic History   Marital status: Single    Spouse  name: Not on file   Number of children: Not on file   Years of education: Not on file   Highest education level: Not on file  Occupational History   Not on file  Tobacco Use   Smoking status: Never   Smokeless tobacco: Never  Substance and Sexual Activity   Alcohol use: Not on file   Drug use: Not on file   Sexual  activity: Not on file  Other Topics Concern   Not on file  Social History Narrative   Not on file   Social Determinants of Health   Financial Resource Strain: Not on file  Food Insecurity: Not on file  Transportation Needs: Not on file  Physical Activity: Not on file  Stress: Not on file  Social Connections: Not on file   SDOH:  SDOH Screenings   Alcohol Screen: Not on file  Depression (PHQ2-9): Medium Risk   PHQ-2 Score: 10  Financial Resource Strain: Not on file  Food Insecurity: Not on file  Housing: Not on file  Physical Activity: Not on file  Social Connections: Not on file  Stress: Not on file  Tobacco Use: Low Risk    Smoking Tobacco Use: Never   Smokeless Tobacco Use: Never   Passive Exposure: Not on file  Transportation Needs: Not on file    Tobacco Cessation:  N/A, patient does not currently use tobacco products  Current Medications:  Current Facility-Administered Medications  Medication Dose Route Frequency Provider Last Rate Last Admin   acetaminophen (TYLENOL) tablet 650 mg  650 mg Oral Q6H PRN Penn, Cicely, NP       alum & mag hydroxide-simeth (MAALOX/MYLANTA) 200-200-20 MG/5ML suspension 30 mL  30 mL Oral Q4H PRN Penn, Cicely, NP       amLODipine (NORVASC) tablet 5 mg  5 mg Oral Daily Estella Husk, MD   5 mg at 10/14/21 1622   hydrOXYzine (ATARAX) tablet 50 mg  50 mg Oral TID PRN Mcneil Sober, NP   50 mg at 10/15/21 0414   lithium carbonate capsule 300 mg  300 mg Oral BID Penn, Cranston Neighbor, NP   300 mg at 10/14/21 2133   magnesium hydroxide (MILK OF MAGNESIA) suspension 30 mL  30 mL Oral Daily PRN Penn, Cranston Neighbor, NP       potassium chloride SA (KLOR-CON M) CR tablet 20 mEq  20 mEq Oral BID Estella Husk, MD   20 mEq at 10/14/21 1622   Current Outpatient Medications  Medication Sig Dispense Refill   FLUoxetine (PROZAC) 20 MG capsule Take 1 capsule (20 mg total) by mouth daily. 7 capsule 0   lithium carbonate 150 MG capsule Take 1 capsule (150 mg  total) by mouth 2 (two) times daily with a meal. 14 capsule 0   traZODone (DESYREL) 50 MG tablet Take 1 tablet (50 mg total) by mouth at bedtime as needed for sleep. 7 tablet 0   amLODipine (NORVASC) 5 MG tablet Take 1 tablet (5 mg total) by mouth daily. 30 tablet 0    PTA Medications: (Not in a hospital admission)   Musculoskeletal  Strength & Muscle Tone: within normal limits Gait & Station: normal Patient leans: N/A  Psychiatric Specialty Exam  Presentation  General Appearance: Appropriate for Environment  Eye Contact:Good  Speech:Clear and Coherent  Speech Volume:Normal  Handedness:Right   Mood and Affect  Mood:Anxious  Affect:Congruent   Thought Process  Thought Processes:Coherent  Descriptions of Associations:Intact  Orientation:Full (Time, Place and Person)  Thought Content:Logical  Diagnosis of Schizophrenia or Schizoaffective disorder in past: No    Hallucinations:Hallucinations: None  Ideas of Reference:None  Suicidal Thoughts:Suicidal Thoughts: No SI Active Intent and/or Plan: With Intent; With Plan  Homicidal Thoughts:Homicidal Thoughts: No   Sensorium  Memory:Immediate Good; Recent Good; Remote Good  Judgment:Fair  Insight:Fair   Executive Functions  Concentration:Fair  Attention Span:Good  Recall:Good  Fund of Knowledge:Good  Language:Good   Psychomotor Activity  Psychomotor Activity:Psychomotor Activity: Normal   Assets  Assets:Desire for Improvement   Sleep  Sleep:Sleep: Fair Number of Hours of Sleep: 0   Nutritional Assessment (For OBS and FBC admissions only) Has the patient had a weight loss or gain of 10 pounds or more in the last 3 months?: No Has the patient had a decrease in food intake/or appetite?: No Does the patient have dental problems?: No Does the patient have eating habits or behaviors that may be indicators of an eating disorder including binging or inducing vomiting?: No Has the patient recently  lost weight without trying?: 0 Has the patient been eating poorly because of a decreased appetite?: 0 Malnutrition Screening Tool Score: 0    Physical Exam  Physical Exam Vitals and nursing note reviewed.  Pulmonary:     Effort: Pulmonary effort is normal.  Neurological:     Mental Status: He is oriented to person, place, and time.  Psychiatric:        Attention and Perception: Attention normal.        Mood and Affect: Mood is anxious.        Speech: Speech normal.        Behavior: Behavior normal. Behavior is cooperative.        Thought Content: Thought content normal.        Cognition and Memory: Cognition normal.        Judgment: Judgment normal.   ROS Blood pressure 139/85, pulse 99, temperature 98.2 F (36.8 C), temperature source Oral, resp. rate 16, SpO2 100 %. There is no height or weight on file to calculate BMI.  Demographic Factors:  Male and Low socioeconomic status  Loss Factors: Decrease in vocational status  Historical Factors: Family history of mental illness or substance abuse  Risk Reduction Factors:   NA  Continued Clinical Symptoms:  Bipolar Disorder:   Mixed State  Cognitive Features That Contribute To Risk:  Closed-mindedness    Suicide Risk:  Minimal: No identifiable suicidal ideation.  Patients presenting with no risk factors but with morbid ruminations; may be classified as minimal risk based on the severity of the depressive symptoms  Plan Of Care/Follow-up recommendations:  Activity:  as tolerated  Diet:  heart healthy   Disposition: Take all medications as prescribed. Keep all follow-up appointments as scheduled.  Do not consume alcohol or use illegal drugs while on prescription medications. Report any adverse effects from your medications to your primary care provider promptly.  In the event of recurrent symptoms or worsening symptoms, call 911, a crisis hotline, or go to the nearest emergency department for evaluation.    Oneta Rack, NP 10/15/2021, 8:14 AM

## 2021-10-17 ENCOUNTER — Encounter (HOSPITAL_COMMUNITY): Payer: Self-pay | Admitting: Emergency Medicine

## 2021-10-17 ENCOUNTER — Emergency Department (HOSPITAL_COMMUNITY)
Admission: EM | Admit: 2021-10-17 | Discharge: 2021-10-18 | Disposition: A | Discharge status: Home / Self Care | Payer: Medicaid - Out of State | Source: Home / Self Care | Attending: Emergency Medicine | Admitting: Emergency Medicine

## 2021-10-17 ENCOUNTER — Other Ambulatory Visit: Payer: Self-pay

## 2021-10-17 DIAGNOSIS — Z20822 Contact with and (suspected) exposure to covid-19: Secondary | ICD-10-CM | POA: Insufficient documentation

## 2021-10-17 DIAGNOSIS — Z79899 Other long term (current) drug therapy: Secondary | ICD-10-CM | POA: Insufficient documentation

## 2021-10-17 DIAGNOSIS — R45851 Suicidal ideations: Secondary | ICD-10-CM | POA: Insufficient documentation

## 2021-10-17 DIAGNOSIS — F319 Bipolar disorder, unspecified: Secondary | ICD-10-CM | POA: Insufficient documentation

## 2021-10-17 DIAGNOSIS — K219 Gastro-esophageal reflux disease without esophagitis: Secondary | ICD-10-CM | POA: Insufficient documentation

## 2021-10-17 DIAGNOSIS — F603 Borderline personality disorder: Secondary | ICD-10-CM | POA: Insufficient documentation

## 2021-10-17 DIAGNOSIS — F909 Attention-deficit hyperactivity disorder, unspecified type: Secondary | ICD-10-CM | POA: Insufficient documentation

## 2021-10-17 DIAGNOSIS — F4310 Post-traumatic stress disorder, unspecified: Secondary | ICD-10-CM | POA: Insufficient documentation

## 2021-10-17 DIAGNOSIS — F419 Anxiety disorder, unspecified: Secondary | ICD-10-CM | POA: Insufficient documentation

## 2021-10-17 DIAGNOSIS — F1994 Other psychoactive substance use, unspecified with psychoactive substance-induced mood disorder: Secondary | ICD-10-CM | POA: Insufficient documentation

## 2021-10-17 LAB — COMPREHENSIVE METABOLIC PANEL
ALT: 20 U/L (ref 0–44)
AST: 18 U/L (ref 15–41)
Albumin: 4.3 g/dL (ref 3.5–5.0)
Alkaline Phosphatase: 92 U/L (ref 38–126)
Anion gap: 7 (ref 5–15)
BUN: 15 mg/dL (ref 6–20)
CO2: 28 mmol/L (ref 22–32)
Calcium: 9.6 mg/dL (ref 8.9–10.3)
Chloride: 104 mmol/L (ref 98–111)
Creatinine, Ser: 0.76 mg/dL (ref 0.61–1.24)
GFR, Estimated: >60 mL/min (ref >60)
Glucose, Bld: 105 mg/dL — ABNORMAL HIGH (ref 70–99)
Potassium: 3.9 mmol/L (ref 3.5–5.1)
Sodium: 139 mmol/L (ref 135–145)
Total Bilirubin: 0.6 mg/dL (ref 0.3–1.2)
Total Protein: 8.2 g/dL — ABNORMAL HIGH (ref 6.5–8.1)

## 2021-10-17 LAB — RESP PANEL BY RT-PCR (FLU A&B, COVID) ARPGX2
Influenza A by PCR: NEGATIVE
Influenza B by PCR: NEGATIVE
SARS Coronavirus 2 by RT PCR: NEGATIVE

## 2021-10-17 LAB — RAPID URINE DRUG SCREEN, HOSP PERFORMED
Amphetamines: POSITIVE — AB
Barbiturates: NOT DETECTED
Benzodiazepines: NOT DETECTED
Cocaine: NOT DETECTED
Opiates: NOT DETECTED
Tetrahydrocannabinol: NOT DETECTED

## 2021-10-17 LAB — ETHANOL: Alcohol, Ethyl (B): <10 mg/dL (ref <10)

## 2021-10-17 LAB — CBC
HCT: 43.8 % (ref 39.0–52.0)
Hemoglobin: 13.9 g/dL (ref 13.0–17.0)
MCH: 26.1 pg (ref 26.0–34.0)
MCHC: 31.7 g/dL (ref 30.0–36.0)
MCV: 82.2 fL (ref 80.0–100.0)
Platelets: 332 10*3/uL (ref 150–400)
RBC: 5.33 MIL/uL (ref 4.22–5.81)
RDW: 13.9 % (ref 11.5–15.5)
WBC: 9 10*3/uL (ref 4.0–10.5)
nRBC: 0 % (ref 0.0–0.2)

## 2021-10-17 LAB — SALICYLATE LEVEL: Salicylate Lvl: <7 mg/dL — ABNORMAL LOW (ref 7.0–30.0)

## 2021-10-17 LAB — ACETAMINOPHEN LEVEL: Acetaminophen (Tylenol), Serum: <10 ug/mL — ABNORMAL LOW (ref 10–30)

## 2021-10-17 MED ORDER — LITHIUM CARBONATE 150 MG PO CAPS
150.0000 mg | ORAL_CAPSULE | Freq: Two times a day (BID) | ORAL | Status: DC
  Administered 2021-10-17 – 2021-10-18 (×2): 150 mg via ORAL
  Filled 2021-10-17 (×3): qty 1

## 2021-10-17 MED ORDER — ZIPRASIDONE MESYLATE 20 MG IM SOLR: 20.0000 mg | Freq: Two times a day (BID) | INTRAMUSCULAR | Status: DC | PRN

## 2021-10-17 MED ORDER — LORAZEPAM 1 MG PO TABS
1.0000 mg | ORAL_TABLET | ORAL | Status: AC | PRN
  Administered 2021-10-18: 08:00:00 1 mg via ORAL
  Filled 2021-10-17: qty 1

## 2021-10-17 MED ORDER — TRAZODONE HCL 50 MG PO TABS: 50.0000 mg | ORAL_TABLET | Freq: Every evening | ORAL | Status: DC | PRN

## 2021-10-17 MED ORDER — IBUPROFEN 200 MG PO TABS
600.0000 mg | ORAL_TABLET | Freq: Once | ORAL | Status: AC
  Administered 2021-10-17: 18:00:00 600 mg via ORAL
  Filled 2021-10-17: qty 3

## 2021-10-17 MED ORDER — AMLODIPINE BESYLATE 5 MG PO TABS
5.0000 mg | ORAL_TABLET | Freq: Every day | ORAL | Status: DC
  Administered 2021-10-17 – 2021-10-18 (×2): 5 mg via ORAL
  Filled 2021-10-17 (×2): qty 1

## 2021-10-17 MED ORDER — FLUOXETINE HCL 20 MG PO CAPS
20.0000 mg | ORAL_CAPSULE | Freq: Every day | ORAL | Status: DC
  Administered 2021-10-17 – 2021-10-18 (×2): 20 mg via ORAL
  Filled 2021-10-17 (×2): qty 1

## 2021-10-17 NOTE — BH Assessment (Addendum)
Pt received night meds around 18:00 per nurse Junior.  Will try to teleassess when pt is alert and oriented.

## 2021-10-17 NOTE — ED Notes (Signed)
Pt has 3 belonging bags in locker 30. Pt confirms all belongings are present and signed the Personal Belongings Inventory Sheet with the following items listed. Red high top sneakers, black leather jacket, jeans, belt, red leggings, red shirt, blue hoodie, red jacket, cell phone, cell phone charging cord, deodorant, and snacks.

## 2021-10-17 NOTE — ED Triage Notes (Signed)
Patient brought in voluntarily by [employer]. States patient was at [employer] and called for SI thoughts. Patient states his plan is to become aggressive with the cops in hopes they would shoot him or he would OD on meds. Patient states he was recently at [facility] and [facility].

## 2021-10-17 NOTE — ED Provider Notes (Signed)
[facility]-EMERGENCY DEPT Provider Note   CSN: [id number] Arrival date & time: 10/17/21  1154     History Chief Complaint  Patient presents with   Suicidal    Jonathan Montgomery is a 30 y.o. male.  30 year old male with history bipolar disorder presents with suicidal ideations.  States that he wants to walk into traffic to hurt himself.  He also states that he would try to have a police officer shoot him.  Notes that he is attempted to commit suicide the past return of the building.  Notes that he has been noncompliant with his medications.  Has had recent visits to behavioral health service line for these similar symptoms.  Called [employer] and was brought here for further evaluation.  Denies any active suicide attempt      Past Medical History:  Diagnosis Date   ADHD    Bipolar 1 disorder (HCC)    Borderline personality disorder (HCC)    GERD (gastroesophageal reflux disease)     Patient Active Problem List   Diagnosis Date Noted   Psychoactive substance-induced mood disorder (HCC) 02/06/2021   Substance induced mood disorder (HCC) 01/27/2021    Past Surgical History:  Procedure Laterality Date   OPEN REDUCTION INTERNAL FIXATION (ORIF) DISTAL RADIAL FRACTURE Right 02/05/2021   Procedure: OPEN REDUCTION INTERNAL FIXATION (ORIF) DISTAL RADIAL FRACTURE and Irrigation & debridement of open fracture;  Surgeon: Betha Loa, MD;  Location: [facility] OR;  Service: Orthopedics;  Laterality: Right;       History reviewed. No pertinent family history.  Social History   Tobacco Use   Smoking status: Never   Smokeless tobacco: Never  Substance Use Topics   Alcohol use: Yes   Drug use: Yes    Types: Methamphetamines, IV    Comment: heroin    Home Medications Prior to Admission medications   Medication Sig Start Date End Date Taking? Authorizing Provider  amLODipine (NORVASC) 5 MG tablet Take 1 tablet (5 mg total) by mouth daily. 10/15/21   Oneta Rack, NP   FLUoxetine (PROZAC) 20 MG capsule Take 1 capsule (20 mg total) by mouth daily. 07/26/21   Ardis Hughs, NP  lithium carbonate 150 MG capsule Take 1 capsule (150 mg total) by mouth 2 (two) times daily with a meal. 07/26/21   Ardis Hughs, NP  traZODone (DESYREL) 50 MG tablet Take 1 tablet (50 mg total) by mouth at bedtime as needed for sleep. 07/26/21   Ardis Hughs, NP    Allergies    Patient has no known allergies.  Review of Systems   Review of Systems  All other systems reviewed and are negative.  Physical Exam Updated Vital Signs BP (!) 152/88 (BP Location: Right Arm)    Pulse 69    Temp 98 F (36.7 C) (Oral)    Resp 18    SpO2 100%   Physical Exam Vitals and nursing note reviewed.  Constitutional:      General: He is not in acute distress.    Appearance: Normal appearance. He is well-developed. He is not toxic-appearing.  HENT:     Head: Normocephalic and atraumatic.  Eyes:     General: Lids are normal.     Conjunctiva/sclera: Conjunctivae normal.     Pupils: Pupils are equal, round, and reactive to light.  Neck:     Thyroid: No thyroid mass.     Trachea: No tracheal deviation.  Cardiovascular:     Rate and Rhythm: Normal rate and  regular rhythm.     Heart sounds: Normal heart sounds. No murmur heard.   No gallop.  Pulmonary:     Effort: Pulmonary effort is normal. No respiratory distress.     Breath sounds: Normal breath sounds. No stridor. No decreased breath sounds, wheezing, rhonchi or rales.  Abdominal:     General: There is no distension.     Palpations: Abdomen is soft.     Tenderness: There is no abdominal tenderness. There is no rebound.  Musculoskeletal:        General: No tenderness. Normal range of motion.     Cervical back: Normal range of motion and neck supple.  Skin:    General: Skin is warm and dry.     Findings: No abrasion or rash.  Neurological:     Mental Status: He is alert and oriented to person, place, and time. Mental  status is at baseline.     GCS: GCS eye subscore is 4. GCS verbal subscore is 5. GCS motor subscore is 6.     Cranial Nerves: No cranial nerve deficit.     Sensory: No sensory deficit.     Motor: Motor function is intact.  Psychiatric:        Attention and Perception: Attention normal.        Mood and Affect: Mood is elated.        Speech: Speech is rapid and pressured.        Behavior: Behavior is hyperactive.        Thought Content: Thought content includes suicidal ideation. Thought content includes suicidal plan.    ED Results / Procedures / Treatments   Labs (all labs ordered are listed, but only abnormal results are displayed) Labs Reviewed  RAPID URINE DRUG SCREEN, HOSP PERFORMED - Abnormal; Notable for the following components:      Result Value   Amphetamines POSITIVE (*)    All other components within normal limits  RESP PANEL BY RT-PCR (FLU A&B, COVID) ARPGX2  CBC  COMPREHENSIVE METABOLIC PANEL  ETHANOL  SALICYLATE LEVEL  ACETAMINOPHEN LEVEL    EKG None  Radiology No results found.  Procedures Procedures   Medications Ordered in ED Medications  ziprasidone (GEODON) injection 20 mg (has no administration in time range)    And  LORazepam (ATIVAN) tablet 1 mg (has no administration in time range)    ED Course  I have reviewed the triage vital signs and the nursing notes.  Pertinent labs & imaging results that were available during my care of the patient were reviewed by me and considered in my medical decision making (see chart for details).    MDM Rules/Calculators/A&P                         Patient medically clear for psychiatric referral    Final Clinical Impression(s) / ED Diagnoses Final diagnoses:  None    Rx / DC Orders ED Discharge Orders     None        Lorre Nick, MD 10/17/21 1333

## 2021-10-17 NOTE — ED Notes (Signed)
Pt calm and cooperative since I assumed care of him at 15:15 today. Pt lying in bed watching TV with the lights off. Compliant with medications. No AVH reported.

## 2021-10-18 ENCOUNTER — Encounter (HOSPITAL_COMMUNITY): Payer: Self-pay | Admitting: Psychiatry

## 2021-10-18 ENCOUNTER — Inpatient Hospital Stay (HOSPITAL_COMMUNITY)
Admission: AD | Admit: 2021-10-18 | Discharge: 2021-10-22 | DRG: 885 | Disposition: A | Discharge status: Home / Self Care | Payer: Medicaid - Out of State | Source: Intra-hospital | Attending: Psychiatry | Admitting: Psychiatry

## 2021-10-18 ENCOUNTER — Other Ambulatory Visit: Payer: Self-pay

## 2021-10-18 DIAGNOSIS — Z5900 Homelessness unspecified: Secondary | ICD-10-CM

## 2021-10-18 DIAGNOSIS — F17210 Nicotine dependence, cigarettes, uncomplicated: Secondary | ICD-10-CM | POA: Diagnosis present

## 2021-10-18 DIAGNOSIS — Z818 Family history of other mental and behavioral disorders: Secondary | ICD-10-CM

## 2021-10-18 DIAGNOSIS — G2581 Restless legs syndrome: Secondary | ICD-10-CM | POA: Diagnosis present

## 2021-10-18 DIAGNOSIS — F1994 Other psychoactive substance use, unspecified with psychoactive substance-induced mood disorder: Secondary | ICD-10-CM | POA: Diagnosis present

## 2021-10-18 DIAGNOSIS — Z9114 Patient's other noncompliance with medication regimen: Secondary | ICD-10-CM | POA: Diagnosis not present

## 2021-10-18 DIAGNOSIS — F332 Major depressive disorder, recurrent severe without psychotic features: Secondary | ICD-10-CM | POA: Diagnosis present

## 2021-10-18 DIAGNOSIS — F1590 Other stimulant use, unspecified, uncomplicated: Secondary | ICD-10-CM | POA: Diagnosis present

## 2021-10-18 DIAGNOSIS — K5900 Constipation, unspecified: Secondary | ICD-10-CM | POA: Diagnosis present

## 2021-10-18 DIAGNOSIS — F603 Borderline personality disorder: Secondary | ICD-10-CM | POA: Diagnosis present

## 2021-10-18 DIAGNOSIS — G4700 Insomnia, unspecified: Secondary | ICD-10-CM | POA: Diagnosis present

## 2021-10-18 DIAGNOSIS — R569 Unspecified convulsions: Secondary | ICD-10-CM | POA: Diagnosis present

## 2021-10-18 DIAGNOSIS — M419 Scoliosis, unspecified: Secondary | ICD-10-CM | POA: Diagnosis present

## 2021-10-18 DIAGNOSIS — F909 Attention-deficit hyperactivity disorder, unspecified type: Secondary | ICD-10-CM | POA: Diagnosis present

## 2021-10-18 DIAGNOSIS — R45851 Suicidal ideations: Secondary | ICD-10-CM | POA: Diagnosis present

## 2021-10-18 DIAGNOSIS — K30 Functional dyspepsia: Secondary | ICD-10-CM | POA: Diagnosis present

## 2021-10-18 DIAGNOSIS — F1510 Other stimulant abuse, uncomplicated: Secondary | ICD-10-CM | POA: Diagnosis present

## 2021-10-18 DIAGNOSIS — F1123 Opioid dependence with withdrawal: Secondary | ICD-10-CM | POA: Diagnosis present

## 2021-10-18 DIAGNOSIS — F410 Panic disorder [episodic paroxysmal anxiety] without agoraphobia: Secondary | ICD-10-CM | POA: Diagnosis present

## 2021-10-18 DIAGNOSIS — F1110 Opioid abuse, uncomplicated: Secondary | ICD-10-CM | POA: Diagnosis present

## 2021-10-18 DIAGNOSIS — F1010 Alcohol abuse, uncomplicated: Secondary | ICD-10-CM | POA: Diagnosis present

## 2021-10-18 DIAGNOSIS — I10 Essential (primary) hypertension: Secondary | ICD-10-CM | POA: Diagnosis present

## 2021-10-18 DIAGNOSIS — R5383 Other fatigue: Secondary | ICD-10-CM | POA: Diagnosis present

## 2021-10-18 DIAGNOSIS — K219 Gastro-esophageal reflux disease without esophagitis: Secondary | ICD-10-CM | POA: Diagnosis present

## 2021-10-18 DIAGNOSIS — Z20822 Contact with and (suspected) exposure to covid-19: Secondary | ICD-10-CM | POA: Diagnosis present

## 2021-10-18 DIAGNOSIS — Z79899 Other long term (current) drug therapy: Secondary | ICD-10-CM | POA: Diagnosis not present

## 2021-10-18 DIAGNOSIS — F3181 Bipolar II disorder: Principal | ICD-10-CM | POA: Diagnosis present

## 2021-10-18 MED ORDER — TRAZODONE HCL 50 MG PO TABS
50.0000 mg | ORAL_TABLET | Freq: Every evening | ORAL | Status: DC | PRN
  Administered 2021-10-18: 21:00:00 50 mg via ORAL
  Filled 2021-10-18: qty 1

## 2021-10-18 MED ORDER — ZIPRASIDONE MESYLATE 20 MG IM SOLR: 20.0000 mg | Freq: Two times a day (BID) | INTRAMUSCULAR | Status: DC | PRN

## 2021-10-18 MED ORDER — METHOCARBAMOL 500 MG PO TABS
500.0000 mg | ORAL_TABLET | Freq: Three times a day (TID) | ORAL | Status: DC | PRN
  Administered 2021-10-19 – 2021-10-22 (×7): 500 mg via ORAL
  Filled 2021-10-18 (×7): qty 1

## 2021-10-18 MED ORDER — CLONIDINE HCL 0.1 MG PO TABS
0.1000 mg | ORAL_TABLET | Freq: Four times a day (QID) | ORAL | Status: DC
  Filled 2021-10-18 (×2): qty 1

## 2021-10-18 MED ORDER — CLONIDINE HCL 0.1 MG PO TABS: 0.1000 mg | ORAL_TABLET | Freq: Every day | ORAL | Status: DC

## 2021-10-18 MED ORDER — IBUPROFEN 800 MG PO TABS
800.0000 mg | ORAL_TABLET | Freq: Once | ORAL | Status: AC
  Administered 2021-10-18: 13:00:00 800 mg via ORAL
  Filled 2021-10-18: qty 1

## 2021-10-18 MED ORDER — HYDROXYZINE HCL 25 MG PO TABS
25.0000 mg | ORAL_TABLET | Freq: Three times a day (TID) | ORAL | Status: DC | PRN
  Administered 2021-10-18 – 2021-10-21 (×7): 25 mg via ORAL
  Filled 2021-10-18 (×7): qty 1
  Filled 2021-10-18: qty 10

## 2021-10-18 MED ORDER — MAGNESIUM HYDROXIDE 400 MG/5ML PO SUSP: 30.0000 mL | Freq: Every day | ORAL | Status: DC | PRN

## 2021-10-18 MED ORDER — LORAZEPAM 1 MG PO TABS: 1.0000 mg | ORAL_TABLET | ORAL | Status: DC | PRN

## 2021-10-18 MED ORDER — ENSURE ENLIVE PO LIQD
237.0000 mL | Freq: Two times a day (BID) | ORAL | Status: DC
  Administered 2021-10-18 – 2021-10-22 (×8): 237 mL via ORAL
  Filled 2021-10-18 (×12): qty 237

## 2021-10-18 MED ORDER — LOPERAMIDE HCL 2 MG PO CAPS
2.0000 mg | ORAL_CAPSULE | ORAL | Status: DC | PRN
  Administered 2021-10-19: 20:00:00 2 mg via ORAL
  Filled 2021-10-18: qty 1

## 2021-10-18 MED ORDER — ONDANSETRON 4 MG PO TBDP
4.0000 mg | ORAL_TABLET | Freq: Four times a day (QID) | ORAL | Status: DC | PRN
  Administered 2021-10-19 – 2021-10-20 (×2): 4 mg via ORAL
  Filled 2021-10-18 (×2): qty 1

## 2021-10-18 MED ORDER — NICOTINE 14 MG/24HR TD PT24
14.0000 mg | MEDICATED_PATCH | Freq: Every day | TRANSDERMAL | Status: DC
  Administered 2021-10-19 – 2021-10-21 (×3): 14 mg via TRANSDERMAL
  Filled 2021-10-18 (×7): qty 1

## 2021-10-18 MED ORDER — CLONIDINE HCL 0.1 MG PO TABS: 0.1000 mg | ORAL_TABLET | ORAL | Status: DC

## 2021-10-18 MED ORDER — FLUOXETINE HCL 20 MG PO CAPS
20.0000 mg | ORAL_CAPSULE | Freq: Every day | ORAL | Status: DC
  Administered 2021-10-19 – 2021-10-22 (×4): 20 mg via ORAL
  Filled 2021-10-18: qty 7
  Filled 2021-10-18 (×5): qty 1

## 2021-10-18 MED ORDER — ALUM & MAG HYDROXIDE-SIMETH 200-200-20 MG/5ML PO SUSP
30.0000 mL | ORAL | Status: DC | PRN
  Administered 2021-10-21: 11:00:00 30 mL via ORAL
  Filled 2021-10-18: qty 30

## 2021-10-18 MED ORDER — AMLODIPINE BESYLATE 5 MG PO TABS
5.0000 mg | ORAL_TABLET | Freq: Every day | ORAL | Status: DC
  Administered 2021-10-19 – 2021-10-22 (×4): 5 mg via ORAL
  Filled 2021-10-18: qty 7
  Filled 2021-10-18 (×6): qty 1

## 2021-10-18 MED ORDER — DICYCLOMINE HCL 20 MG PO TABS
20.0000 mg | ORAL_TABLET | Freq: Four times a day (QID) | ORAL | Status: DC | PRN
  Administered 2021-10-19 – 2021-10-20 (×4): 20 mg via ORAL
  Filled 2021-10-18 (×5): qty 1

## 2021-10-18 MED ORDER — NAPROXEN 500 MG PO TABS
500.0000 mg | ORAL_TABLET | Freq: Two times a day (BID) | ORAL | Status: DC | PRN
  Administered 2021-10-19 – 2021-10-22 (×7): 500 mg via ORAL
  Filled 2021-10-18 (×7): qty 1

## 2021-10-18 MED ORDER — LITHIUM CARBONATE 150 MG PO CAPS
150.0000 mg | ORAL_CAPSULE | Freq: Two times a day (BID) | ORAL | Status: DC
  Administered 2021-10-18 – 2021-10-19 (×2): 150 mg via ORAL
  Filled 2021-10-18 (×7): qty 1

## 2021-10-18 MED ORDER — ACETAMINOPHEN 325 MG PO TABS
650.0000 mg | ORAL_TABLET | Freq: Four times a day (QID) | ORAL | Status: DC | PRN
  Administered 2021-10-18 – 2021-10-22 (×7): 650 mg via ORAL
  Filled 2021-10-18 (×7): qty 2

## 2021-10-18 NOTE — BH Assessment (Signed)
[facility] Assessment Progress Note   Per Elta Guadeloupe, NP, this pt requires psychiatric hospitalization at this time.  Danika, RN, [facility] has assigned pt to [facility] Rm 306-1 to the service of Dr Loleta Chance.  [facility] will be ready to receive pt at 12:00.  Pt has signed Voluntary Admission and Consent for Treatment, as well as Consent to Release Information to no one, and signed forms have been faxed to [facility].  EDP Gwyneth Sprout, MD and pt's nurse, Florentina Addison, have been notified, and Florentina Addison agrees to send original paperwork along with pt via [employer], and to call report to [phone].  Doylene Canning, [place] [facility] Coordinator [phone]

## 2021-10-18 NOTE — Plan of Care (Signed)
Pt aware of drug use as a negative impact on mental health, and voices a desire to remain sober.

## 2021-10-18 NOTE — ED Notes (Signed)
Returned pt 3 belonging bags. Pt confirms all belongings are present and signed the Personal Belongings Inventory Sheet with the following items listed. Red high top sneakers, black leather jacket, jeans, belt, red leggings, red shirt, blue hoodie, red jacket, cell phone, cell phone charging cord, deodorant, and snacks. Pt changed from purple scrubs into jeans, blue hoodie, red shirt, and red high top sneakers without the laces. Pt transporting to [facility] with 2 belonging bags.
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
## 2021-10-18 NOTE — Tx Team (Signed)
Initial Treatment Plan 10/18/2021 3:20 PM Jonathan Montgomery [id number]    PATIENT STRESSORS: Substance abuse   Traumatic event     PATIENT STRENGTHS: Ability for insight  Motivation for treatment/growth    PATIENT IDENTIFIED PROBLEMS: Reports willingness to consent for treatment for depression and suicidal ideation.   Verbalizes readiness to abstain from heroin use.                    DISCHARGE CRITERIA:  Ability to meet basic life and health needs Improved stabilization in mood, thinking, and/or behavior Reduction of life-threatening or endangering symptoms to within safe limits  PRELIMINARY DISCHARGE PLAN: Attend aftercare/continuing care group  PATIENT/FAMILY INVOLVEMENT: This treatment plan has been presented to and reviewed with the patient, Jonathan Montgomery.  The patient and family have been given the opportunity to ask questions and make suggestions.  Daune Perch, RN 10/18/2021, 3:20 PM

## 2021-10-18 NOTE — BH Assessment (Signed)
Admit Note. Patient admitted to [facility] hospital voluntarily. Patient states he has been increasingly depressed, worried about keeping himself safe '' the thoughts were coming and I have been impulsive in the past so I knew I needed to come in. '' Patient states he has been actively using heroine for three months '' shooting up in my arms'' and I need to get help. I need to get back on my meds for my mood and get to a residential rehab. '' Patient denies any A/V Hallucinations and was calm and cooperative throughout admission. Patient reports losing 30 -40 lbs over the three month period while using heroine. Patient skin searched and belongings searched, no contraband found. Patient does have multiple scars to bilateral forarms which are darkened areas from recent repair. Patient also has scar to forehead with darkened skin. Patient states he made a serious suicide attempt at [place] of this year (April 2022) in which he slit both wrists and jumped from hotel building. He reports having been hospitalized several times, including as an adolescent. Last hospitalized after major attempt in April, per patient. Patient was oriented to the unit, ward rules. Patient is requesting HIV and Hep C testing as he is IV drug using. Pt states '' I need to get back on my meds, affordable meds. Lithium is what worked best for me, but I've been out of it.'' Pt able to contract for safety, denies any HI. Report given to primary RN. MHT aware of patient on the unit. Pt was given sandwich, appears in no acute distress at this time.

## 2021-10-18 NOTE — [facility] Group Notes (Signed)
[facility] Group Notes:  (Nursing/MHT/Case Management/Adjunct)  Date:  10/18/2021  Time:  4:25 PM  Type of Therapy:  Psychoeducational Skills  Participation Level:  Did Not Attend  Participation Quality:      Affect:      Cognitive:      Insight:  None  Engagement in Group:      Modes of Intervention:      Summary of Progress/Problems:Did not attend  Reymundo Poll 10/18/2021, 4:25 PM

## 2021-10-18 NOTE — BH Assessment (Signed)
@  2330, contacted patient's nurse Katie, RN to see if patient is ready to be seen by TTS for his initial TTS assessment.
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
## 2021-10-18 NOTE — [facility] Group Notes (Signed)
PT was informed but did not attended group.

## 2021-10-19 ENCOUNTER — Encounter (HOSPITAL_COMMUNITY): Payer: Self-pay

## 2021-10-19 LAB — LIPID PANEL
Cholesterol: 136 mg/dL (ref 0–200)
HDL: 56 mg/dL (ref >40)
LDL Cholesterol: 69 mg/dL (ref 0–99)
Total CHOL/HDL Ratio: 2.4 RATIO
Triglycerides: 53 mg/dL (ref <150)
VLDL: 11 mg/dL (ref 0–40)

## 2021-10-19 LAB — HEPATITIS C ANTIBODY: HCV Ab: NONREACTIVE

## 2021-10-19 LAB — TSH: TSH: 2.109 u[IU]/mL (ref 0.350–4.500)

## 2021-10-19 LAB — HEMOGLOBIN A1C
Hgb A1c MFr Bld: 5.6 % (ref 4.8–5.6)
Mean Plasma Glucose: 114.02 mg/dL

## 2021-10-19 LAB — HIV ANTIBODY (ROUTINE TESTING W REFLEX): HIV Screen 4th Generation wRfx: NONREACTIVE

## 2021-10-19 MED ORDER — TRAZODONE HCL 50 MG PO TABS
50.0000 mg | ORAL_TABLET | Freq: Every evening | ORAL | Status: DC | PRN
  Administered 2021-10-19 – 2021-10-21 (×3): 50 mg via ORAL
  Filled 2021-10-19: qty 14
  Filled 2021-10-19 (×3): qty 1

## 2021-10-19 MED ORDER — QUETIAPINE FUMARATE 50 MG PO TABS
50.0000 mg | ORAL_TABLET | Freq: Every day | ORAL | Status: DC
  Administered 2021-10-19: 22:00:00 50 mg via ORAL
  Filled 2021-10-19 (×3): qty 1

## 2021-10-19 NOTE — Progress Notes (Signed)
Provider notified. Administered scheduled Amlodipine per [facility] at [phone] - Provider aware.  Pt having back pain 8/10 and muscle pain. Administered PRN Robaxin, Naproxen for pain and muscle cramps.  Pt with high anxiety. Administered PRN Hydroxyzine for anxiety.  All PRNs administered per [facility] per pt request.   10/19/21 0610  Vital Signs  Temp 97.9 F (36.6 C)  Temp Source Oral  Pulse Rate (!) 110  Pulse Rate Source Monitor  BP (!) 137/106  BP Location Left Arm  BP Method Automatic  Patient Position (if appropriate) Sitting  Oxygen Therapy  SpO2 100 %     10/19/21 0611  Vital Signs  Pulse Rate (!) 130  Pulse Rate Source Monitor  Resp 17  BP (!) 137/126  BP Location Left Arm  BP Method Automatic  Patient Position (if appropriate) Standing  Oxygen Therapy  SpO2 99 %
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
## 2021-10-19 NOTE — Group Note (Signed)
LCSW Group Note   Jonathan Montgomery [id number] DATE : 10/19/2021  TIME: 2:15 PM  Type of Group and Topic: Psychoeducational Group: Discharge Planning   Participation Level: Did Not Attend   Description of Group   Discharge planning group reviews patients anticipated discharge plans and assists patients to anticipate and address any barriers to wellness/recovery in the community. Suicide prevention education is reviewed with patients in group.   Therapeutic Goals   1. Patients will state their anticipated discharge plan and mental health aftercare   2. Patients will identify potential barriers to wellness in the community setting   3. Patients will engage in problem solving, solution focused discussion of ways to anticipate and address barriers to wellness/recovery   Summary of Patient Progress: Patient did not attend group despite encouraged participation.     Therapeutic Modalities:   Motivational Interviewing  Signed:  Corky Crafts, MSW, [place], LCASA 10/19/2021 2:15 PM

## 2021-10-19 NOTE — BH IP Treatment Plan (Signed)
Interdisciplinary Treatment and Diagnostic Plan Update  10/19/2021 Time of Session: 9:20am  Jonathan [place] MRN: [id number]  Principal Diagnosis: MDD (major depressive disorder), recurrent severe, without psychosis ([place])  Secondary Diagnoses: Principal Problem:   MDD (major depressive disorder), recurrent severe, without psychosis ([place])   Current Medications:  Current Facility-Administered Medications  Medication Dose Route Frequency Provider Last Rate Last Admin   acetaminophen (TYLENOL) tablet 650 mg  650 mg Oral Q6H PRN Ethelene Hal, NP   650 mg at 10/19/21 0950   alum & mag hydroxide-simeth (MAALOX/MYLANTA) 200-200-20 MG/5ML suspension 30 mL  30 mL Oral Q4H PRN Ethelene Hal, NP       amLODipine (NORVASC) tablet 5 mg  5 mg Oral Daily Ethelene Hal, NP   5 mg at 10/19/21 5176   dicyclomine (BENTYL) tablet 20 mg  20 mg Oral Q6H PRN Prescilla Sours, PA-C   20 mg at 10/19/21 0949   feeding supplement (ENSURE ENLIVE / ENSURE PLUS) liquid 237 mL  237 mL Oral BID BM Ethelene Hal, NP   237 mL at 10/18/21 1558   FLUoxetine (PROZAC) capsule 20 mg  20 mg Oral Daily Ethelene Hal, NP   20 mg at 10/19/21 0900   hydrOXYzine (ATARAX) tablet 25 mg  25 mg Oral TID PRN Ethelene Hal, NP   25 mg at 10/19/21 1607   loperamide (IMODIUM) capsule 2-4 mg  2-4 mg Oral PRN Margorie John W, PA-C       ziprasidone (GEODON) injection 20 mg  20 mg Intramuscular Q12H PRN Ethelene Hal, NP       And   LORazepam (ATIVAN) tablet 1 mg  1 mg Oral PRN Ethelene Hal, NP       magnesium hydroxide (MILK OF MAGNESIA) suspension 30 mL  30 mL Oral Daily PRN Ethelene Hal, NP       methocarbamol (ROBAXIN) tablet 500 mg  500 mg Oral Q8H PRN Margorie John W, PA-C   500 mg at 10/19/21 3710   naproxen (NAPROSYN) tablet 500 mg  500 mg Oral BID PRN Prescilla Sours, PA-C   500 mg at 10/19/21 6269   nicotine (NICODERM CQ - dosed in mg/24 hours) patch 14 mg  14 mg  Transdermal Daily Ethelene Hal, NP   14 mg at 10/19/21 0900   ondansetron (ZOFRAN-ODT) disintegrating tablet 4 mg  4 mg Oral Q6H PRN Prescilla Sours, PA-C   4 mg at 10/19/21 4854   QUEtiapine (SEROQUEL) tablet 50 mg  50 mg Oral Aliene Altes, MD       traZODone (DESYREL) tablet 50 mg  50 mg Oral QHS PRN,MR X 1 France Ravens, MD       PTA Medications: Medications Prior to Admission  Medication Sig Dispense Refill Last Dose   amLODipine (NORVASC) 5 MG tablet Take 1 tablet (5 mg total) by mouth daily. 30 tablet 0    FLUoxetine (PROZAC) 20 MG capsule Take 1 capsule (20 mg total) by mouth daily. 7 capsule 0    lithium carbonate 150 MG capsule Take 1 capsule (150 mg total) by mouth 2 (two) times daily with a meal. 14 capsule 0    traZODone (DESYREL) 50 MG tablet Take 1 tablet (50 mg total) by mouth at bedtime as needed for sleep. 7 tablet 0     Patient Stressors: Substance abuse   Traumatic event    Patient Strengths: Ability for insight  Motivation for treatment/growth   Treatment  Modalities: Medication Management, Group therapy, Case management,  1 to 1 session with clinician, Psychoeducation, Recreational therapy.   Physician Treatment Plan for Primary Diagnosis: MDD (major depressive disorder), recurrent severe, without psychosis ([place]) Long Term Goal(s):     Short Term Goals:    Medication Management: Evaluate patient's response, side effects, and tolerance of medication regimen.  Therapeutic Interventions: 1 to 1 sessions, Unit Group sessions and Medication administration.  Evaluation of Outcomes: Not Met  Physician Treatment Plan for Secondary Diagnosis: Principal Problem:   MDD (major depressive disorder), recurrent severe, without psychosis ([place])  Long Term Goal(s):     Short Term Goals:       Medication Management: Evaluate patient's response, side effects, and tolerance of medication regimen.  Therapeutic Interventions: 1 to 1 sessions, Unit Group sessions and  Medication administration.  Evaluation of Outcomes: Not Met   RN Treatment Plan for Primary Diagnosis: MDD (major depressive disorder), recurrent severe, without psychosis ([place]) Long Term Goal(s): Knowledge of disease and therapeutic regimen to maintain health will improve  Short Term Goals: Ability to remain free from injury will improve, Ability to demonstrate self-control, Ability to participate in decision making will improve, Ability to verbalize feelings will improve, Ability to disclose and discuss suicidal ideas, and Ability to identify and develop effective coping behaviors will improve  Medication Management: RN will administer medications as ordered by provider, will assess and evaluate patient's response and provide education to patient for prescribed medication. RN will report any adverse and/or side effects to prescribing provider.  Therapeutic Interventions: 1 on 1 counseling sessions, Psychoeducation, Medication administration, Evaluate responses to treatment, Monitor vital signs and CBGs as ordered, Perform/monitor CIWA, COWS, AIMS and Fall Risk screenings as ordered, Perform wound care treatments as ordered.  Evaluation of Outcomes: Not Met   LCSW Treatment Plan for Primary Diagnosis: MDD (major depressive disorder), recurrent severe, without psychosis ([place]) Long Term Goal(s): Safe transition to appropriate next level of care at discharge, Engage patient in therapeutic group addressing interpersonal concerns.  Short Term Goals: Engage patient in aftercare planning with referrals and resources, Increase social support, Increase emotional regulation, Facilitate acceptance of mental health diagnosis and concerns, Identify triggers associated with mental health/substance abuse issues, and Increase skills for wellness and recovery  Therapeutic Interventions: Assess for all discharge needs, 1 to 1 time with Social worker, Explore available resources and support systems, Assess for  adequacy in community support network, Educate family and significant other(s) on suicide prevention, Complete Psychosocial Assessment, Interpersonal group therapy.  Evaluation of Outcomes: Not Met   Progress in Treatment: Attending groups: No. Participating in groups: No. Taking medication as prescribed: Yes. Toleration medication: Yes. Family/Significant other contact made: Yes, individual(s) contacted:  If consents are provided  Patient understands diagnosis: Yes. Discussing patient identified problems/goals with staff: Yes. Medical problems stabilized or resolved: Yes. Denies suicidal/homicidal ideation: Yes. Issues/concerns per patient self-inventory: No.   New problem(s) identified: No, Describe:  None   New Short Term/Long Term Goal(s): medication stabilization, elimination of SI thoughts, development of comprehensive mental wellness plan.   Patient Goals:  Did not attend   Discharge Plan or Barriers: Patient recently admitted. CSW will continue to follow and assess for appropriate referrals and possible discharge planning.   Reason for Continuation of Hospitalization: Depression Medication stabilization Suicidal ideation Withdrawal symptoms  Estimated Length of Stay: 3 to 5 days    Scribe for Treatment Team: Darleen Crocker, Latanya Presser 10/19/2021 2:22 PM

## 2021-10-19 NOTE — Progress Notes (Signed)
NUTRITION ASSESSMENT  Pt identified as at risk on the Malnutrition Screen Tool  INTERVENTION: 1. Supplements: Ensure Enlive po BID, each supplement provides 350 kcal and 20 grams of protein  NUTRITION DIAGNOSIS: Unintentional weight loss related to sub-optimal intake as evidenced by pt report.   Goal: Pt to meet >/= 90% of their estimated nutrition needs.  Monitor:  PO intake  Assessment:  Pt admitted for depression and anxiety.  Pt reporting decreased appetite. Per weight records, pt has lost 11 lbs since 4/16 (5% wt loss x 8 months, insignificant for time frame).  Ensure has been ordered.  Height: Ht Readings from Last 1 Encounters:  10/18/21 5\' 11"  (1.803 m)    Weight: Wt Readings from Last 1 Encounters:  10/18/21 81.2 kg    Weight Hx: Wt Readings from Last 10 Encounters:  10/18/21 81.2 kg  10/15/21 90.7 kg  07/21/21 90.7 kg  02/05/21 90.7 kg    BMI:  Body mass index is 24.97 kg/m. Pt meets criteria for normal based on current BMI.  Estimated Nutritional Needs: Kcal: 25-30 kcal/kg Protein: > 1 gram protein/kg Fluid: 1 ml/kcal  Diet Order:  Diet Order             Diet regular Room service appropriate? Yes; Fluid consistency: Thin  Diet effective now                  Pt is also offered choice of unit snacks mid-morning and mid-afternoon.  Pt is eating as desired.   Lab results and medications reviewed.   02/07/21, MS, RD, LDN Inpatient Clinical Dietitian Contact information available via Amion

## 2021-10-19 NOTE — Progress Notes (Addendum)
Pt observed in bed mostly and periodically in day room briefly for snacks. Pt complains of high anxiety and moderate depression.  Pt denies SI//HI and verbally contracts for safety.  Pt denies AVH.  Administered PRNs for pt leg muscle cramp & pain rated 8/10 to include PRN (Bentyl, Robaxin, Naproxen).  Administered PRN Hydroxyzine for anxiety.  Administered PRN Loperamide for pt's diarrhea.  All per [facility] per pt request.  Pt is safe on unit with Q 15 minute safety checks.   10/19/21 1958  Psych Admission Type (Psych Patients Only)  Admission Status Voluntary  Psychosocial Assessment  Patient Complaints Anxiety;Depression;Insomnia  Eye Contact Fair  Facial Expression Flat  Affect Flat;Depressed  Speech Logical/coherent  Interaction Assertive  Motor Activity Other (Comment) (wdl)  Appearance/Hygiene Unremarkable  Behavior Characteristics Anxious;Cooperative  Mood Depressed;Anxious  Thought Process  Coherency WDL  Content WDL  Delusions None reported or observed  Perception WDL  Hallucination None reported or observed  Judgment Limited  Confusion None  Danger to Self  Current suicidal ideation? Passive  Self-Injurious Behavior Some self-injurious ideation observed or expressed.  No lethal plan expressed   Agreement Not to Harm Self Yes  Description of Agreement verbal contract for safety  Danger to Others  Danger to Others None reported or observed

## 2021-10-19 NOTE — Plan of Care (Signed)
°  Problem: Activity: Goal: Interest or engagement in leisure activities will improve Outcome: Not Progressing Goal: Imbalance in normal sleep/wake cycle will improve Outcome: Not Progressing   Problem: Coping: Goal: Coping ability will improve Outcome: Not Progressing

## 2021-10-19 NOTE — Plan of Care (Signed)
Nurse discussed anxiety and coping skills with patient. 

## 2021-10-19 NOTE — BHH Group Notes (Signed)
The focus of this group is to help patients establish daily goals to achieve during treatment and discuss how the patient can incorporate goal setting into their daily lives to aide in recovery.  Pt did not attend 
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
## 2021-10-19 NOTE — Group Note (Signed)
Recreation Therapy Group Note   Group Topic:Stress Management  Group Date: 10/19/2021 Start Time: 0930 End Time: 0945 Facilitators: Caroll Rancher, LRT,CTRS Location: [address] Dayroom   Goal Area(s) Addresses:  Patient will actively participate in stress management techniques presented during session.  Patient will successfully identify benefit of practicing stress management post d/c.    Group Description: Guided Imagery. LRT provided education, instruction, and demonstration on practice of visualization via guided imagery. Patient was asked to participate in the technique introduced during session. LRT debriefed including topics of mindfulness, stress management and specific scenarios each patient could use these techniques. Patients were given suggestions of ways to access scripts post d/c and encouraged to explore Youtube and other apps available on smartphones, tablets, and computers.   Affect/Mood: N/A   Participation Level: Did not attend    Clinical Observations/Individualized Feedback:     Plan: Continue to engage patient in RT group sessions 2-3x/week.   Caroll Rancher, LRT,CTRS 10/19/2021 11:40 AM

## 2021-10-19 NOTE — Group Note (Incomplete)
LCSW Group Therapy Note   Group Date: 10/17/2021 Start Time: 1300 End Time: 1400   Type of Therapy and Topic:  Group Therapy:   Participation Level:  {BHH PARTICIPATION XNATF:[id number]}  Description of Group:   Therapeutic Goals:  1.     Summary of Patient Progress:    ***  Therapeutic Modalities:   Kathrynn Humble 10/19/2021  3:55 PM

## 2021-10-19 NOTE — Progress Notes (Addendum)
°  Pt presents with high anxiety and moderate depression.  Pt also reports pain in back and legs of 8 out of 10.  Administered PRN Trazodone and Hydroxyzine per [facility] per pt request for anxiety and for help with sleep.  Pt is safe on the unit Q 15 minute safety checks.    10/18/21 2100  Psych Admission Type (Psych Patients Only)  Admission Status Voluntary  Psychosocial Assessment  Patient Complaints Depression;Anxiety  Eye Contact Fair  Facial Expression Flat  Affect Flat;Depressed  Speech Logical/coherent  Interaction Assertive  Motor Activity Other (Comment) (wdl)  Appearance/Hygiene Unremarkable  Behavior Characteristics Cooperative  Mood Depressed  Thought Process  Coherency WDL  Content WDL  Delusions None reported or observed  Perception WDL  Hallucination None reported or observed  Judgment Limited  Confusion None  Danger to Self  Current suicidal ideation? Passive  Description of Suicide Plan Denies plan  Self-Injurious Behavior Some self-injurious ideation observed or expressed.  No lethal plan expressed   Agreement Not to Harm Self Yes  Description of Agreement verbal contract for safety  Danger to Others  Danger to Others None reported or observed

## 2021-10-19 NOTE — Progress Notes (Signed)
The patient rated his day as a 5 out of a possible 10. He admits to being depressed and suicidal. He also admits to having a drug problem as well. His goal for tomorrow is to get out of his room more.

## 2021-10-20 MED ORDER — OLANZAPINE 10 MG IM SOLR: 5.0000 mg | Freq: Three times a day (TID) | INTRAMUSCULAR | Status: DC | PRN

## 2021-10-20 MED ORDER — OLANZAPINE 5 MG PO TBDP: 5.0000 mg | ORAL_TABLET | Freq: Three times a day (TID) | ORAL | Status: DC | PRN

## 2021-10-20 MED ORDER — OLANZAPINE 2.5 MG PO TABS
2.5000 mg | ORAL_TABLET | Freq: Every day | ORAL | Status: DC
  Administered 2021-10-20 – 2021-10-21 (×2): 2.5 mg via ORAL
  Filled 2021-10-20: qty 1
  Filled 2021-10-20: qty 7
  Filled 2021-10-20 (×2): qty 1

## 2021-10-20 NOTE — Progress Notes (Addendum)
On assessment, pt is moderately anxious and reports leg and back muscle pain 5/10.  Pt reports he spoke with is mom.  According to patient, he will be attending an inpatient faith based program that is a year long.  Pt denies SI/HI and verbally contracts for safety.  Pt denies AVH.  Administered PRNs (Hydroxyzine, Bentyl, Robaxin) for anxiety and pain per [facility] per pt request.  Pt is safe on unit with Q 15 minute safety checks.   10/20/21 2021  Psych Admission Type (Psych Patients Only)  Admission Status Voluntary  Psychosocial Assessment  Patient Complaints Anxiety  Eye Contact Fair  Facial Expression Flat  Affect Flat;Anxious  Speech Logical/coherent  Interaction Assertive  Motor Activity Other (Comment) (wdl)  Appearance/Hygiene Unremarkable  Behavior Characteristics Cooperative;Anxious  Mood Anxious;Pleasant  Thought Process  Coherency WDL  Content WDL  Delusions None reported or observed  Perception WDL  Hallucination None reported or observed  Judgment Limited  Confusion None  Danger to Self  Current suicidal ideation? Denies  Self-Injurious Behavior Some self-injurious ideation observed or expressed.  No lethal plan expressed   Agreement Not to Harm Self Yes  Description of Agreement verbal contract for safety  Danger to Others  Danger to Others None reported or observed

## 2021-10-20 NOTE — BHH Counselor (Addendum)
CSW spoke to [place] with [facility] [phone], who states pt has been accepted to their program. Pt will be admitted there Saturday at 3pm and will need a 7 days supply of medications and a printed prescription.   Fredirick Lathe, LCSWA Clinicial Social Worker [employer]
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
## 2021-10-20 NOTE — Plan of Care (Signed)
°  Problem: Education: Goal: Utilization of techniques to improve thought processes will improve Outcome: Not Progressing Goal: Knowledge of the prescribed therapeutic regimen will improve Outcome: Not Progressing   Problem: Activity: Goal: Interest or engagement in leisure activities will improve Outcome: Not Progressing   

## 2021-10-20 NOTE — Progress Notes (Addendum)
D:  Patient's self inventory sheet, patient has fair sleep, sleep medicine helpful.  Good appetite, low energy level, poor concentration.  Rated depression 5, hopeless 4, anxiety 7.  Withdrawals, tremors, diarrhea,  chilling, runny nose.  SI, contracts for safety.  Physical pain, pain med is helping.  No more info for staff.  Denied discharge information. A:  Medications administered per MD orders.  Emotional support and encouragement given patient.   R:  Safety maintained with 15 minute checks.

## 2021-10-20 NOTE — Plan of Care (Signed)
°  Problem: Education: Goal: Knowledge of the prescribed therapeutic regimen will improve Outcome: Progressing   Problem: Activity: Goal: Interest or engagement in leisure activities will improve Outcome: Progressing   Problem: Coping: Goal: Coping ability will improve Outcome: Progressing Goal: Will verbalize feelings Outcome: Progressing   

## 2021-10-20 NOTE — Group Note (Signed)
Date:  10/20/2021 Time:  1:40 PM  Group Topic/Focus:  Goals Group:   The focus of this group is to help patients establish daily goals to achieve during treatment and discuss how the patient can incorporate goal setting into their daily lives to aide in recovery.    Participation Level:  Active  Participation Quality:  Appropriate  Affect:  Appropriate  Cognitive:  Appropriate  Insight: Appropriate  Engagement in Group:  Engaged  Modes of Intervention:  Discussion  Additional Comments:  Pt wants to get his interview done for placement.  Jaquita Rector 10/20/2021, 1:40 PM

## 2021-10-20 NOTE — BHH Counselor (Signed)
CSW gave pt the number to [facility] and asked him to call and complete a phone interview around 12pm. Pt agreed.   Fredirick Lathe, LCSWA Clinicial Social Worker [employer]

## 2021-10-20 NOTE — Progress Notes (Addendum)
[facility] MD Progress Note  10/20/2021 11:37 AM Jonathan Montgomery  MRN:  [id number] Reason for Admission:  Patient is a 30 year old male with reported history of borderline personality disorder, bipolar depressive disorder, methamphetamine use disorder, alcohol use disorder, and opiate use disorder presenting from behavioral health urgent care due to suicidal ideation for 1 week as well as opiate withdrawal. Principal Problem: MDD (major depressive disorder), recurrent severe, without psychosis (HCC) Diagnosis: Principal Problem:   MDD (major depressive disorder), recurrent severe, without psychosis (HCC)  Total Time spent with patient: 30 minutes  Past Psychiatric History: see H&P  SUBJECTIVE Patient was seen and assessed with attending Dr. Loleta Chance.  Patient reports that he feels a little better as he continues to go through opiate withdrawal.  Patient reports that as needed medications have been beneficial for him.  Patient reports concern about him being on lithium given he has elevated blood pressure, tachycardia and poor hydration due to his homelessness.  Patient reports mood feeling depressed and anxious.  Patient reports that he is still having suicidal thoughts but not homicidal thoughts.  Patient denies auditory/visual hallucinations.  Patient reports that he was able to sleep well yesterday evening and has been eating as he can.  Patient reports having regular bowel movements and has no GU complaints at this time.  Discussed plan to continue monitoring patient for opiate withdrawal symptoms.  Discussed that we have the as needed medications appropriate for opiate withdrawals so he should continue to discuss these with nurses and he will be provided with medications as needed.  Discussed we would continue him on his Seroquel and Prozac in order to best manage his depression and racing thoughts.   Past Medical History:  Past Medical History:  Diagnosis Date   ADHD    Bipolar 1 disorder (HCC)     Borderline personality disorder (HCC)    GERD (gastroesophageal reflux disease)     Past Surgical History:  Procedure Laterality Date   OPEN REDUCTION INTERNAL FIXATION (ORIF) DISTAL RADIAL FRACTURE Right 02/05/2021   Procedure: OPEN REDUCTION INTERNAL FIXATION (ORIF) DISTAL RADIAL FRACTURE and Irrigation & debridement of open fracture;  Surgeon: Betha Loa, MD;  Location: [facility] OR;  Service: Orthopedics;  Laterality: Right;   Family History: No family history on file. Family Psychiatric  History: see H&P Social History:  Social History   Substance and Sexual Activity  Alcohol Use Yes     Social History   Substance and Sexual Activity  Drug Use Yes   Frequency: 4.0 times per week   Types: Methamphetamines, IV, Heroin, Marijuana   Comment: heroin- IV drug use, not used in 5 days per pt    Social History   Socioeconomic History   Marital status: Single    Spouse name: Not on file   Number of children: Not on file   Years of education: Not on file   Highest education level: Not on file  Occupational History   Not on file  Tobacco Use   Smoking status: Every Day    Packs/day: 1.00    Years: 3.00    Pack years: 3.00    Types: Cigarettes   Smokeless tobacco: Not on file   Tobacco comments:    Pt is requesting nicotine patch  Vaping Use   Vaping Use: Unknown  Substance and Sexual Activity   Alcohol use: Yes   Drug use: Yes    Frequency: 4.0 times per week    Types: Methamphetamines, IV, Heroin, Marijuana    Comment:  heroin- IV drug use, not used in 5 days per pt   Sexual activity: Yes    Partners: Female  Other Topics Concern   Not on file  Social History Narrative   Not on file   Social Determinants of Health   Financial Resource Strain: Not on file  Food Insecurity: Not on file  Transportation Needs: Not on file  Physical Activity: Not on file  Stress: Not on file  Social Connections: Not on file   Additional Social History:                          Sleep: Fair  Appetite:  Fair  Current Medications: Current Facility-Administered Medications  Medication Dose Route Frequency Provider Last Rate Last Admin   acetaminophen (TYLENOL) tablet 650 mg  650 mg Oral Q6H PRN Ethelene Hal, NP   650 mg at 10/19/21 0950   alum & mag hydroxide-simeth (MAALOX/MYLANTA) 200-200-20 MG/5ML suspension 30 mL  30 mL Oral Q4H PRN Ethelene Hal, NP       amLODipine (NORVASC) tablet 5 mg  5 mg Oral Daily Ethelene Hal, NP   5 mg at 10/20/21 0849   dicyclomine (BENTYL) tablet 20 mg  20 mg Oral Q6H PRN Margorie John W, PA-C   20 mg at 10/20/21 1011   feeding supplement (ENSURE ENLIVE / ENSURE PLUS) liquid 237 mL  237 mL Oral BID BM Ethelene Hal, NP   237 mL at 10/19/21 1600   FLUoxetine (PROZAC) capsule 20 mg  20 mg Oral Daily Ethelene Hal, NP   20 mg at 10/20/21 [id number]   hydrOXYzine (ATARAX) tablet 25 mg  25 mg Oral TID PRN Ethelene Hal, NP   25 mg at 10/20/21 1012   loperamide (IMODIUM) capsule 2-4 mg  2-4 mg Oral PRN Prescilla Sours, PA-C   2 mg at 10/19/21 1958   ziprasidone (GEODON) injection 20 mg  20 mg Intramuscular Q12H PRN Ethelene Hal, NP       And   LORazepam (ATIVAN) tablet 1 mg  1 mg Oral PRN Ethelene Hal, NP       magnesium hydroxide (MILK OF MAGNESIA) suspension 30 mL  30 mL Oral Daily PRN Ethelene Hal, NP       methocarbamol (ROBAXIN) tablet 500 mg  500 mg Oral Q8H PRN Margorie John W, PA-C   500 mg at 10/20/21 1012   naproxen (NAPROSYN) tablet 500 mg  500 mg Oral BID PRN Margorie John W, PA-C   500 mg at 10/20/21 1011   nicotine (NICODERM CQ - dosed in mg/24 hours) patch 14 mg  14 mg Transdermal Daily Ethelene Hal, NP   14 mg at 10/20/21 0850   ondansetron (ZOFRAN-ODT) disintegrating tablet 4 mg  4 mg Oral Q6H PRN Prescilla Sours, PA-C   4 mg at 10/20/21 1012   QUEtiapine (SEROQUEL) tablet 50 mg  50 mg Oral Aliene Altes, MD   50 mg at 10/19/21 2214   traZODone  (DESYREL) tablet 50 mg  50 mg Oral QHS PRN,MR X 1 France Ravens, MD   50 mg at 10/19/21 2214    Lab Results:  Results for orders placed or performed during the hospital encounter of 10/18/21 (from the past 48 hour(s))  HIV Antibody (routine testing w rflx)     Status: None   Collection Time: 10/19/21  6:16 AM  Result Value Ref Range   HIV  Screen 4th Generation wRfx Non Reactive Non Reactive    Comment: Performed at [facility], [place] [address] [place], [place] [address]  Hepatitis C antibody     Status: None   Collection Time: 10/19/21  6:16 AM  Result Value Ref Range   HCV Ab NON REACTIVE NON REACTIVE    Comment: (NOTE) Nonreactive HCV antibody screen is consistent with no HCV infections,  unless recent infection is suspected or other evidence exists to indicate HCV infection.  Performed at [facility], [place] [address], [place], [place] [address]   Hemoglobin A1c     Status: None   Collection Time: 10/19/21  6:16 AM  Result Value Ref Range   Hgb A1c MFr Bld 5.6 4.8 - 5.6 %    Comment: (NOTE) Pre diabetes:          5.7%-6.4%  Diabetes:              >6.4%  Glycemic control for   <7.0% adults with diabetes    Mean Plasma Glucose 114.02 mg/dL    Comment: Performed at [place] [address] [place], [place] [address]  Lipid panel     Status: None   Collection Time: 10/19/21  6:16 AM  Result Value Ref Range   Cholesterol 136 0 - 200 mg/dL   Triglycerides 53 <150 mg/dL   HDL 56 >40 mg/dL   Total CHOL/HDL Ratio 2.4 RATIO   VLDL 11 0 - 40 mg/dL   LDL Cholesterol 69 0 - 99 mg/dL    Comment:        Total Cholesterol/HDL:CHD Risk Coronary Heart Disease Risk Table                     Men   Women  1/2 Average Risk   3.4   3.3  Average Risk       5.0   4.4  2 X Average Risk   9.6   7.1  3 X Average Risk  23.4   11.0        Use the calculated Patient Ratio above and the CHD Risk Table to determine the patient's CHD Risk.        ATP III CLASSIFICATION  (LDL):  <100     mg/dL   Optimal  100-129  mg/dL   Near or Above                    Optimal  130-159  mg/dL   Borderline  160-189  mg/dL   High  >190     mg/dL   Very High Performed at [facility] [address], [place], [place] [address]   TSH     Status: None   Collection Time: 10/19/21  6:16 AM  Result Value Ref Range   TSH 2.109 0.350 - 4.500 uIU/mL    Comment: Performed by a 3rd Generation assay with a functional sensitivity of <=0.01 uIU/mL. Performed at [facility], [place] [address], [place], [place] [address]     Blood Alcohol level:  Lab Results  Component Value Date   [facility] <10 10/17/2021   ETH <10 [id number]    Metabolic Disorder Labs: Lab Results  Component Value Date   HGBA1C 5.6 10/19/2021   MPG 114.02 10/19/2021   MPG 111.15 07/23/2021   No results found for: PROLACTIN Lab Results  Component Value Date   CHOL 136 10/19/2021   TRIG 53  10/19/2021   HDL 56 10/19/2021   CHOLHDL 2.4 10/19/2021   VLDL 11 10/19/2021   LDLCALC 69 10/19/2021   LDLCALC 73 10/14/2021    Physical Findings: AIMS:  , ,  ,  ,    CIWA:    COWS:  COWS Total Score: 8  Musculoskeletal: Strength & Muscle Tone: within normal limits Gait & Station: normal Patient leans: N/A  Psychiatric Specialty Exam:  Presentation  General Appearance: Disheveled; Casual  Eye Contact:Minimal  Speech:-- (Rapid, rambling)  Speech Volume:Normal  Handedness:Right   Mood and Affect  Mood:Anxious; Depressed; Labile  Affect:Congruent; Labile   Thought Process  Thought Processes:Coherent  Descriptions of Associations:Intact  Orientation:Full (Time, Place and Person)  Thought Content:Perseveration; Paranoid Ideation  History of Schizophrenia/Schizoaffective disorder:No  Duration of Psychotic Symptoms:Less than six months  Hallucinations:Hallucinations: None  Ideas of Reference:Paranoia  Suicidal Thoughts:Suicidal Thoughts: Yes,  Passive SI Passive Intent and/or Plan: With Intent; With Plan; Without Means to Carry Out  Homicidal Thoughts:Homicidal Thoughts: No   Sensorium  Memory:Immediate Good; Recent Good; Remote Good  Judgment:Impaired  Insight:Shallow   Executive Functions  Concentration:Fair  Attention Span:Fair  [place]   Psychomotor Activity  Psychomotor Activity:Psychomotor Activity: Normal   Assets  Assets:Communication Skills; Desire for Improvement   Sleep  Sleep:Sleep: Fair    Physical Exam: Physical Exam Vitals and nursing note reviewed.  Constitutional:      Appearance: Normal appearance. He is normal weight.  HENT:     Head: Normocephalic and atraumatic.  Pulmonary:     Effort: Pulmonary effort is normal.  Neurological:     General: No focal deficit present.     Mental Status: He is oriented to person, place, and time.   Review of Systems  Respiratory:  Negative for shortness of breath.   Cardiovascular:  Negative for chest pain.  Gastrointestinal:  Negative for abdominal pain, constipation, diarrhea, heartburn, nausea and vomiting.  Neurological:  Negative for headaches.  Blood pressure (!) 129/96, pulse (!) 137, temperature 97.8 F (36.6 C), temperature source Oral, resp. rate 18, height 5\' 11"  (1.803 m), weight 81.2 kg, SpO2 99 %. Body mass index is 24.97 kg/m.   Treatment Plan Summary: Daily contact with patient to assess and evaluate symptoms and progress in treatment and Medication management  ASSESSMENT Patient is a 30 year old male with reported history of borderline personality disorder, bipolar depressive disorder, methamphetamine use disorder, alcohol use disorder, and opiate use disorder presenting from behavioral health urgent care due to suicidal ideation for 1 week as well as opiate withdrawal.   PLAN Safety and Monitoring: Voluntary admission to inpatient psychiatric unit for safety, stabilization  and treatment Daily contact with patient to assess and evaluate symptoms and progress in treatment Patient's case to be discussed in multi-disciplinary team meeting Observation Level : q15 minute checks Vital signs: q12 hours Precautions: suicide, elopement, and assault     Psychiatric Problems Bipolar disorder, depressed episode Opiate use disorder, currently withdrawing Methamphetamine use disorder Nicotine use disorder -Continue Seroquel 50 mg nightly for insomnia and mood stabilization -Continue home med Prozac 20 mg daily for depressive symptoms -Discontinue lithium as patient is on a subtherapeutic dose and given patient's homelessness and his report that he was unable to hydrate appropriately while taking lithium -NicoDerm patch 14 mg for nicotine replacement   Medical Problems   PRNs Tylenol 650 mg for mild pain Maalox/Mylanta 30 mL for indigestion Hydroxyzine 25 mg tid for anxiety Milk of Magnesia 30 mL for constipation  Trazodone 50 mg for sleep Zofran 4 mg for nausea vomiting Imodium 2 to 4 mg for diarrhea Bentyl 20 mg for abdominal cramping   4. Discharge Planning: Social work and case management to assist with discharge planning and identification of hospital follow-up needs prior to discharge Estimated LOS: 5-7 days Discharge Concerns: Need to establish a safety plan; Medication compliance and effectiveness Discharge Goals: Return home with outpatient referrals for mental health follow-up including medication management/psychotherapy  France Ravens, MD 10/20/2021, 11:37 AM

## 2021-10-20 NOTE — Plan of Care (Signed)
Nurse discussed anxiety with patient.  

## 2021-10-20 NOTE — Progress Notes (Signed)
BHH Group Notes:  (Nursing/MHT/Case Management/Adjunct)  Date:  10/20/2021  Time:  2015  Type of Therapy:   wrap up group  Participation Level:  Active  Participation Quality:  Appropriate, Attentive, Sharing, and Supportive  Affect:  Appropriate  Cognitive:  Alert  Insight:  Improving  Engagement in Group:  Engaged  Modes of Intervention:  Clarification, Education, and Support  Summary of Progress/Problems: Positive thinking and positive change were discussed.   Jonathan Montgomery 10/20/2021, 8:52 PM

## 2021-10-21 DIAGNOSIS — F332 Major depressive disorder, recurrent severe without psychotic features: Secondary | ICD-10-CM

## 2021-10-21 MED ORDER — GABAPENTIN 100 MG PO CAPS
100.0000 mg | ORAL_CAPSULE | Freq: Three times a day (TID) | ORAL | Status: DC
  Administered 2021-10-21 – 2021-10-22 (×3): 100 mg via ORAL
  Filled 2021-10-21 (×2): qty 1
  Filled 2021-10-21: qty 21
  Filled 2021-10-21 (×2): qty 1
  Filled 2021-10-21: qty 21
  Filled 2021-10-21: qty 1
  Filled 2021-10-21: qty 21
  Filled 2021-10-21: qty 1

## 2021-10-21 MED ORDER — PROPRANOLOL HCL 10 MG PO TABS
10.0000 mg | ORAL_TABLET | Freq: Two times a day (BID) | ORAL | Status: DC
  Administered 2021-10-21 – 2021-10-22 (×2): 10 mg via ORAL
  Filled 2021-10-21: qty 1
  Filled 2021-10-21: qty 14
  Filled 2021-10-21 (×3): qty 1
  Filled 2021-10-21: qty 14

## 2021-10-21 NOTE — Progress Notes (Signed)
At 2125, administered PRN Trazodone for sleep per [facility] per pt requests.

## 2021-10-21 NOTE — [facility] Group Notes (Signed)
Group Note- Orientation and goals group with PsychoEducation.  Pt were asked to identify goal they would like to work on, and unit ward rules were reviewed as well as a schedule of the day.  ''I walk down the street by [employer]'' poem was read to identify negative behavioral patterns.  Pt identified his hole from the poem as addiction, and states he knows now that he must let go of old relationships like his girlfriend as he knows she will trigger him to use.

## 2021-10-21 NOTE — Group Note (Signed)
LCSW Group Therapy Note   Group Date: 10/21/2021 Start Time: 1300 End Time: 1400  Type of Therapy and Topic:  Group Therapy:  Self-Esteem   Participation Level:  Active  Description of Group: This group addressed positive self-esteem. Patients were given a worksheet with a blank shield. Patients were asked what a shield is and when it is used. Patients were asked to list, draw, or write protective factors in the their lives on their shields. Patients discussed the words, ideas and drawings that they put on their shield. Patients were encouraged to have a daily reflection of positive characteristics/ protective factors.  Therapeutic Goals Patient will verbalize two of their positive qualities Patient will demonstrate insight but naming social supports in their lives Patient will verbalize their feelings when voicing positive self affirmations and when voicing positive affirmations of others Patients will discuss the potential positive impact on their wellness/recovery of focusing on positive traits of self and others.  Summary of Patient Progress:Pt shared his name during introductions and stated that he is looking forward to being able to socialize with others again. Pt accepted worksheet and participated appropriately in discussion.   Felizardo Hoffmann, LCSWA 10/21/2021  1:16 PM

## 2021-10-21 NOTE — Progress Notes (Signed)
Pt presents with pleasant mood, affect congruent. Jonathan Montgomery states he is feeling better emotionally that he feels he is getting better but states his depression is still at 4/10 on scale, 10 being worst. He reports anxiety is at 6/10 10 being worst on scale. Pt still reports multiple withdrawal symptoms including restlessness , leg cramps, anxiety. Patient has been to med window multiple times for medications, specifically asking what else he can take, appearing to be at times med seeking. The patient states his goal is to maintain sobriety and to have long term rehab help. Pt has been visible on the unit, and attending unit programming. Pt appears in no acute distress. Will continue to monitor.

## 2021-10-21 NOTE — [facility] Suicide Risk Assessment (Signed)
[facility] INPATIENT:  Family/Significant Other Suicide Prevention Education  Suicide Prevention Education:  Contact Attempts: [name], [phone], (name of family member/significant other) has been identified by the patient as the family member/significant other with whom the patient will be residing, and identified as the person(s) who will aid the patient in the event of a mental health crisis.  With written consent from the patient, two attempts were made to provide suicide prevention education, prior to and/or following the patient's discharge.  We were unsuccessful in providing suicide prevention education.  A suicide education pamphlet was given to the patient to share with family/significant other.  Date and time of first attempt:12/28  /  2:45pm Date and time of second attempt:12/30  /  2:20pm  Jonathan Montgomery 10/21/2021, 2:23 PM

## 2021-10-21 NOTE — Progress Notes (Addendum)
[facility] MD Progress Note  10/21/2021 2:25 PM Jonathan Montgomery  MRN:  [id number]  HPI: Patient is a 30 year old male with reported history of borderline personality disorder, bipolar depressive disorder, self injurious behaviors via cutting, methamphetamine use disorder, alcohol use disorder, and opiate use disorder presenting from behavioral health urgent care due to suicidal ideation for 1 week as well as opiate withdrawal. Pt was transferred to [facility] for treatment and stabilization of his mood.   Principal Problem: MDD (major depressive disorder), recurrent severe, without psychosis ([place]) Diagnosis: Principal Problem:   MDD (major depressive disorder), recurrent severe, without psychosis ([place])  Total Time spent with patient: 20 minutes  Today's Assessment: Patient with some anxiety and restlessness during this assessment. Pt with good eye contact, attention to personal hygiene and grooming is fair, speech clear & coherent, contents logical. Pt currently denies SI/HI/AVH, denies paranoia, and there are no delusional thoughts currently present. Pt reports a good appetite and reports a good sleep quality last night.   Pt's only complaint today is restless legs, related to opioid withdrawal. Pt currently has insight into his drug use, states that he wants to get better because he has realized that he is currently getting older, and wants to be able to get married one day and have a child. Pt states that he has found a program in [facility] where he intends to go to after discharge called "[facility]". Pt states that this is a year long program, and that he intends to maintain his sobriety after discharge from the program.   Pt denies any side effects to his medications. Repeat EKG ordered since last one had Right ventricular hypertrophy (from 12/27). Propranolol 10 mg BID ordered for elevated HR, Gabapentin 100 mg TID ordered for restless legs.   Past Psychiatric History: Please see below  Past  Medical History:  Past Medical History:  Diagnosis Date   ADHD    Bipolar 1 disorder ([place])    Borderline personality disorder ([place])    GERD (gastroesophageal reflux disease)     Past Surgical History:  Procedure Laterality Date   OPEN REDUCTION INTERNAL FIXATION (ORIF) DISTAL RADIAL FRACTURE Right 02/05/2021   Procedure: OPEN REDUCTION INTERNAL FIXATION (ORIF) DISTAL RADIAL FRACTURE and Irrigation & debridement of open fracture;  Surgeon: Leanora Cover, MD;  Location: [place];  Service: Orthopedics;  Laterality: Right;   Family History: No family history on file. Family Psychiatric  History: None provided by patient Social History:  Social History   Substance and Sexual Activity  Alcohol Use Yes     Social History   Substance and Sexual Activity  Drug Use Yes   Frequency: 4.0 times per week   Types: Methamphetamines, IV, Heroin, Marijuana   Comment: heroin- IV drug use, not used in 5 days per pt    Social History   Socioeconomic History   Marital status: Single    Spouse name: Not on file   Number of children: Not on file   Years of education: Not on file   Highest education level: Not on file  Occupational History   Not on file  Tobacco Use   Smoking status: Every Day    Packs/day: 1.00    Years: 3.00    Pack years: 3.00    Types: Cigarettes   Smokeless tobacco: Not on file   Tobacco comments:    Pt is requesting nicotine patch  Vaping Use   Vaping Use: Unknown  Substance and Sexual Activity   Alcohol use: Yes  Drug use: Yes    Frequency: 4.0 times per week    Types: Methamphetamines, IV, Heroin, Marijuana    Comment: heroin- IV drug use, not used in 5 days per pt   Sexual activity: Yes    Partners: Female  Other Topics Concern   Not on file  Social History Narrative   Not on file   Social Determinants of Health   Financial Resource Strain: Not on file  Food Insecurity: Not on file  Transportation Needs: Not on file  Physical Activity: Not on file   Stress: Not on file  Social Connections: Not on file   Sleep: Good  Appetite:  Good  Current Medications: Current Facility-Administered Medications  Medication Dose Route Frequency Provider Last Rate Last Admin   acetaminophen (TYLENOL) tablet 650 mg  650 mg Oral Q6H PRN Ethelene Hal, NP   650 mg at 10/21/21 1155   alum & mag hydroxide-simeth (MAALOX/MYLANTA) 200-200-20 MG/5ML suspension 30 mL  30 mL Oral Q4H PRN Ethelene Hal, NP   30 mL at 10/21/21 1051   amLODipine (NORVASC) tablet 5 mg  5 mg Oral Daily Ethelene Hal, NP   5 mg at 10/21/21 0809   dicyclomine (BENTYL) tablet 20 mg  20 mg Oral Q6H PRN Prescilla Sours, PA-C   20 mg at 10/20/21 2021   feeding supplement (ENSURE ENLIVE / ENSURE PLUS) liquid 237 mL  237 mL Oral BID BM Ethelene Hal, NP   237 mL at 10/21/21 0811   FLUoxetine (PROZAC) capsule 20 mg  20 mg Oral Daily Ethelene Hal, NP   20 mg at 10/21/21 0809   gabapentin (NEURONTIN) capsule 100 mg  100 mg Oral TID Nicholes Rough, NP       hydrOXYzine (ATARAX) tablet 25 mg  25 mg Oral TID PRN Ethelene Hal, NP   25 mg at 10/21/21 1155   loperamide (IMODIUM) capsule 2-4 mg  2-4 mg Oral PRN Prescilla Sours, PA-C   2 mg at 10/19/21 1958   magnesium hydroxide (MILK OF MAGNESIA) suspension 30 mL  30 mL Oral Daily PRN Ethelene Hal, NP       methocarbamol (ROBAXIN) tablet 500 mg  500 mg Oral Q8H PRN Margorie John W, PA-C   500 mg at 10/21/21 0940   naproxen (NAPROSYN) tablet 500 mg  500 mg Oral BID PRN Prescilla Sours, PA-C   500 mg at 10/21/21 0940   nicotine (NICODERM CQ - dosed in mg/24 hours) patch 14 mg  14 mg Transdermal Daily Ethelene Hal, NP   14 mg at 10/21/21 0810   OLANZapine zydis (ZYPREXA) disintegrating tablet 5 mg  5 mg Oral TID PRN Maida Sale, MD       Or   OLANZapine (ZYPREXA) injection 5 mg  5 mg Intramuscular TID PRN Maida Sale, MD       OLANZapine (ZYPREXA) tablet 2.5 mg  2.5 mg  Oral QHS Hill, Jackie Plum, MD   2.5 mg at 10/20/21 2124   ondansetron (ZOFRAN-ODT) disintegrating tablet 4 mg  4 mg Oral Q6H PRN Margorie John W, PA-C   4 mg at 10/20/21 1012   propranolol (INDERAL) tablet 10 mg  10 mg Oral BID Nicholes Rough, NP       traZODone (DESYREL) tablet 50 mg  50 mg Oral QHS PRN,MR X 1 France Ravens, MD   50 mg at 10/20/21 2125    Lab Results: No results found for this or  any previous visit (from the past 48 hour(s)).  Blood Alcohol level:  Lab Results  Component Value Date   ETH <10 10/17/2021   ETH <10 [id number]    Metabolic Disorder Labs: Lab Results  Component Value Date   HGBA1C 5.6 10/19/2021   MPG 114.02 10/19/2021   MPG 111.15 07/23/2021   No results found for: PROLACTIN Lab Results  Component Value Date   CHOL 136 10/19/2021   TRIG 53 10/19/2021   HDL 56 10/19/2021   CHOLHDL 2.4 10/19/2021   VLDL 11 10/19/2021   LDLCALC 69 10/19/2021   LDLCALC 73 10/14/2021    Physical Findings: AIMS:  , ,  ,  ,    CIWA:    COWS:  COWS Total Score: 6  Musculoskeletal: Strength & Muscle Tone: within normal limits Gait & Station: normal Patient leans: N/A  Psychiatric Specialty Exam:  Presentation  General Appearance: Appropriate for Environment; Fairly Groomed  Eye Contact:Fair  Speech:Clear and Coherent  Speech Volume:Normal  Handedness:Right   Mood and Affect  Mood:Anxious  Affect:Appropriate   Thought Process  Thought Processes:Coherent  Descriptions of Associations:Intact  Orientation:Full (Time, Place and Person)  Thought Content:Logical  History of Schizophrenia/Schizoaffective disorder:No  Duration of Psychotic Symptoms:N/A  Hallucinations:No data recorded Ideas of Reference:None  Suicidal Thoughts:Suicidal Thoughts: No  Homicidal Thoughts:Homicidal Thoughts: No   Sensorium  Memory:Immediate Good  Judgment:Fair  Insight:Fair   Executive Functions  Concentration:Fair  Attention  Span:Fair  [place]  Language:Good   Psychomotor Activity  Psychomotor Activity:Psychomotor Activity: Restlessness  Assets  Assets:Housing; Desire for Improvement   Sleep  Sleep:Sleep: Good   Physical Exam: Physical Exam HENT:     Head: Normocephalic.     Nose: No congestion or rhinorrhea.     Mouth/Throat:     Pharynx: No oropharyngeal exudate or posterior oropharyngeal erythema.  Eyes:     General:        Right eye: No discharge.        Left eye: No discharge.  Cardiovascular:     Rate and Rhythm: Normal rate.     Heart sounds: No murmur heard.   No friction rub.  Pulmonary:     Effort: No respiratory distress.     Breath sounds: No wheezing.  Abdominal:     General: There is no distension.  Musculoskeletal:        General: No swelling.     Cervical back: No rigidity.  Skin:    Coloration: Skin is not jaundiced.  Neurological:     Mental Status: He is alert and oriented to person, place, and time.     Cranial Nerves: No cranial nerve deficit.     Sensory: No sensory deficit.     Motor: No weakness.     Coordination: Coordination normal.  Psychiatric:        Thought Content: Thought content normal.   Review of Systems  Constitutional: Negative.  Negative for chills, fever and weight loss.  HENT: Negative.  Negative for congestion and sore throat.   Eyes: Negative.  Negative for blurred vision, double vision and discharge.  Respiratory: Negative.  Negative for cough, sputum production and shortness of breath.   Cardiovascular: Negative.  Negative for chest pain, palpitations and orthopnea.  Gastrointestinal: Negative.  Negative for constipation, diarrhea, heartburn, nausea and vomiting.  Genitourinary: Negative.   Musculoskeletal: Negative.  Negative for back pain and neck pain.  Skin: Negative.  Negative for itching and rash.  Neurological: Negative.  Negative for  dizziness, tingling, tremors, speech change, seizures,  weakness and headaches.  Psychiatric/Behavioral:  Positive for depression (improving with meds). Negative for hallucinations, memory loss, substance abuse and suicidal ideas. The patient is nervous/anxious. The patient does not have insomnia.   Blood pressure (!) 121/94, pulse (!) 113, temperature 98 F (36.7 C), temperature source Oral, resp. rate 18, height 5\' 11"  (1.803 m), weight 81.2 kg, SpO2 100 %. Body mass index is 24.97 kg/m.  PLAN Safety and Monitoring: Voluntary admission to inpatient psychiatric unit for safety, stabilization and treatment Daily contact with patient to assess and evaluate symptoms and progress in treatment Patient's case to be discussed in multi-disciplinary team meeting Observation Level : q15 minute checks Vital signs: q12 hours Precautions: suicide, elopement, and assault      Observation Level/Precautions:  15 minute checks  Laboratory:  Labs reviewed, Repeat EKG ordered  Psychotherapy:  Group sessions  Medications:  See [facility]  Consultations:  As needed  Discharge Concerns:  safety, med compliance  Estimated LOS: 5-7 days  Other:  N/A    Physician Treatment Plan for Primary Diagnosis: MDD (major depressive disorder), recurrent severe, without psychosis (HCC) Long Term Goal(s): Improvement in symptoms so as ready for discharge   Short Term Goals: Ability to identify changes in lifestyle to reduce recurrence of condition will improve, Ability to verbalize feelings will improve, Ability to disclose and discuss suicidal ideas, Ability to demonstrate self-control will improve, Ability to identify and develop effective coping behaviors will improve, Ability to maintain clinical measurements within normal limits will improve, and Compliance with prescribed medications will improve   Physician Treatment Plan for Secondary Diagnosis: Principal Problem: MDD (major depressive disorder), recurrent severe, without psychosis (HCC)  Bipolar disorder, depressed  episode -Continue Prozac 20 mg daily -Continue Zyprexa 2.5 mg daily at bedtime  Restless Legs r/t opioid use disorder -Continue Robaxin 500 mg every 8 hrs PRN -Continue Naproxen 500 mg BID PRN -Start Gabapentin 100 mg TID  Hypertension -Continue Norvasc 5 mg daily -Start Propranolol 20 mg BID for elevated heart rate  Nicotine Replacement -Continue Nicoderm patch 14 mg for nicotine replacement  Agitation Protocol -Continue Zyprexa 5mg  PO/IM TID PRN   PRNs Tylenol 650 mg for mild pain Maalox/Mylanta 30 mL for indigestion Hydroxyzine 25 mg tid for anxiety Milk of Magnesia 30 mL for constipation Trazodone 50 mg for sleep Zofran 4 mg for nausea vomiting Imodium 2 to 4 mg for diarrhea Bentyl 20 mg for abdominal cramping  4. Discharge Planning: Social work and case management to assist with discharge planning and identification of hospital follow-up needs prior to discharge Estimated LOS: 5-7 days Discharge Concerns: Need to establish a safety plan; Medication compliance and effectiveness Discharge Goals: Return home with outpatient referrals for mental health follow-up including medication management/psychotherapy     [facility], NP 10/21/2021, 2:25 PM

## 2021-10-21 NOTE — [facility] Group Notes (Signed)
Adult Psychoeducational Group Note  Date:  10/21/2021 Time:  2:23 PM  Group Topic/Focus:  Dimensions of Wellness:   The focus of this group is to introduce the topic of wellness and discuss the role each dimension of wellness plays in total health.  Participation Level:  Active  Participation Quality:  Appropriate  Affect:  Appropriate  Cognitive:  Appropriate  Insight: Appropriate  Engagement in Group:  Engaged  Modes of Intervention:  Discussion  Additional Comments:  Patient attended group and participated.   [name] [name] 10/21/2021, 2:23 PM

## 2021-10-21 NOTE — Group Note (Signed)
Recreation Therapy Group Note   Group Topic:Stress Management  Group Date: 10/21/2021 Start Time: 0940 End Time: 0950 Facilitators: Caroll Rancher, LRT,CTRS Location: [address] Dayroom   Goal Area(s) Addresses:  Patient will actively participate in stress management techniques presented during session.  Patient will successfully identify benefit of practicing stress management post d/c.   Group Description: Meditation. LRT played a meditation that focused on new horizons and being open to new things/experiences in the coming year.  Patients were to listen and follow along as meditation played to fully engage in activity.  Patients were given suggestions on using the Internet and Apps to locate meditations.   Affect/Mood: Appropriate   Participation Level: Engaged   Participation Quality: Independent   Behavior: Appropriate   Speech/Thought Process: Focused   Insight: Good   Judgement: Good   Modes of Intervention: Meditation   Patient Response to Interventions:  Engaged   Education Outcome:  Acknowledges education   Clinical Observations/Individualized Feedback: Pt attended and participated in group.    Plan: Continue to engage patient in RT group sessions 2-3x/week.   Caroll Rancher, LRT,CTRS  10/21/2021 11:36 AM

## 2021-10-21 NOTE — [facility] Group Notes (Signed)
PsychoEducational group-  °Pt were asked to read a poem by Jo Capacho identifying the brain as both a chimp who over reacts and the owl, who thinks things through using judgement. Patients were then asked to apply this thinking to their own lives and what circumstances contributed to their being here.  °Pt did not attend. °

## 2021-10-21 NOTE — Progress Notes (Signed)
Writer observed pt sitting by the phone in the hallway. Writer asked pt if they needed anything and pt responded that they needed their blood pressure taken. Pt stated "my blood pressure feels high, I need my blood pressure taken". Writer did not appear to be in any distress. Writer alerted pt Nurse and they told writer to take pt blood pressure. While taken pt blood pressure pt tried to squeeze blood pressure cord under their arm. Once writer repositioned cord blood pressure of 124/92 recorded. Pt was calm when blood pressure was revealed. Pt walked to their room without needing any assistance.

## 2021-10-21 NOTE — Progress Notes (Signed)
°  Administered PRN Tylenol for mild to moderate pain related to headache per [facility] per pt request.

## 2021-10-22 MED ORDER — AMLODIPINE BESYLATE 5 MG PO TABS
5.0000 mg | ORAL_TABLET | Freq: Every day | ORAL | 0 refills | Status: AC
Start: 2021-10-22 — End: 2022-10-22

## 2021-10-22 MED ORDER — OLANZAPINE 2.5 MG PO TABS
2.5000 mg | ORAL_TABLET | Freq: Every day | ORAL | 0 refills | Status: AC
Start: 2021-10-22 — End: ?

## 2021-10-22 MED ORDER — TRAZODONE HCL 50 MG PO TABS: 50.0000 mg | ORAL_TABLET | Freq: Every evening | ORAL | 0 refills | Status: AC | PRN

## 2021-10-22 MED ORDER — PROPRANOLOL HCL 10 MG PO TABS: 10.0000 mg | ORAL_TABLET | Freq: Two times a day (BID) | ORAL | 0 refills | Status: AC

## 2021-10-22 MED ORDER — HYDROXYZINE HCL 25 MG PO TABS: 25.0000 mg | ORAL_TABLET | Freq: Three times a day (TID) | ORAL | 0 refills | Status: AC | PRN

## 2021-10-22 MED ORDER — FLUOXETINE HCL 20 MG PO CAPS: 20.0000 mg | ORAL_CAPSULE | Freq: Every day | ORAL | 0 refills | Status: AC

## 2021-10-22 MED ORDER — NICOTINE 14 MG/24HR TD PT24
14.0000 mg | MEDICATED_PATCH | Freq: Every day | TRANSDERMAL | 0 refills | Status: AC
Start: 2021-10-23 — End: ?

## 2021-10-22 MED ORDER — GABAPENTIN 100 MG PO CAPS: 100.0000 mg | ORAL_CAPSULE | Freq: Three times a day (TID) | ORAL | 0 refills | Status: AC

## 2021-10-22 NOTE — Progress Notes (Signed)
°   10/22/21 1000  Psych Admission Type (Psych Patients Only)  Admission Status Voluntary  Psychosocial Assessment  Patient Complaints Anxiety  Eye Contact Fair  Facial Expression Flat  Affect Flat;Anxious  Speech Logical/coherent  Interaction Assertive  Motor Activity Other (Comment) (wdl)  Appearance/Hygiene Unremarkable  Behavior Characteristics Cooperative  Mood Anxious;Pleasant  Aggressive Behavior  Targets Other (Comment) (Patient denies any aggressive behaviors.)  Type of Behavior Other (Comment) (Denies.)  Effect  (Patient denies any aggressive behaviors.)  Thought Process  Coherency WDL  Content WDL  Delusions None reported or observed  Perception WDL  Hallucination None reported or observed  Judgment Limited  Confusion None  Danger to Self  Current suicidal ideation? Denies  Self-Injurious Behavior Some self-injurious ideation observed or expressed.  No lethal plan expressed   Agreement Not to Harm Self Yes  Description of Agreement verbal contract for safety  Danger to Others  Danger to Others None reported or observed

## 2021-10-22 NOTE — BHH Suicide Risk Assessment (Signed)
Suicide Risk Assessment  Discharge Assessment    [facility] Discharge Suicide Risk Assessment   Principal Problem: MDD (major depressive disorder), recurrent severe, without psychosis (HCC) Discharge Diagnoses: Principal Problem:   MDD (major depressive disorder), recurrent severe, without psychosis (HCC)   Total Time spent with patient: 15 minutes Patient is a 30 year old male with reported history of borderline personality disorder, bipolar depressive disorder, self injurious behaviors via cutting, methamphetamine use disorder, alcohol use disorder, and opiate use disorder presenting from behavioral health urgent care due to suicidal ideation for 1 week as well as opiate withdrawal. Pt was transferred to [facility] for treatment and stabilization of his mood. Patient has significant improvement in insight. Patient denies SI, HI, and AVH. Patient endorsed that he wants to remains sober and is looking forward to starting rehab. Patient also endorsed interest in maintaining his physical health regarding his HTN and moving forward in his life.   Musculoskeletal: Strength & Muscle Tone: within normal limits Gait & Station: normal Patient leans: N/A  Psychiatric Specialty Exam  Presentation  General Appearance: Appropriate for Environment; Fairly Groomed  Eye Contact:Fair  Speech:Clear and Coherent  Speech Volume:Normal  Handedness:Right   Mood and Affect  Mood:Anxious  Duration of Depression Symptoms: Greater than two weeks  Affect:Appropriate   Thought Process  Thought Processes:Coherent  Descriptions of Associations:Intact  Orientation:Full (Time, Place and Person)  Thought Content:Logical  History of Schizophrenia/Schizoaffective disorder:No  Duration of Psychotic Symptoms:N/A  Hallucinations:No data recorded Ideas of Reference:None  Suicidal Thoughts:Suicidal Thoughts: No  Homicidal Thoughts:Homicidal Thoughts: No   Sensorium  Memory:Immediate  Good  Judgment:Fair  Insight:Fair   Executive Functions  Concentration:Fair  Attention Span:Fair  Recall:Fair  Fund of Knowledge:Fair  Language:Good   Psychomotor Activity  Psychomotor Activity:Psychomotor Activity: Restlessness   Assets  Assets:Housing; Desire for Improvement   Sleep  Sleep:Sleep: Good   Physical Exam: Physical Exam Constitutional:      Appearance: Normal appearance.  HENT:     Head: Normocephalic and atraumatic.  Pulmonary:     Effort: Pulmonary effort is normal.  Skin:    General: Skin is dry.  Neurological:     Mental Status: He is alert and oriented to person, place, and time.   Review of Systems  Psychiatric/Behavioral:  Negative for hallucinations and suicidal ideas. The patient does not have insomnia.   Blood pressure 128/86, pulse (!) 103, temperature 98.7 F (37.1 C), resp. rate 16, height 5\' 11"  (1.803 m), weight 81.2 kg, SpO2 100 %. Body mass index is 24.97 kg/m.  Mental Status Per Nursing Assessment::   On Admission:  Suicidal ideation indicated by patient, Self-harm thoughts  Demographic Factors:  Male, Adolescent or young adult, Low socioeconomic status, and Unemployed  Loss Factors: NA  Historical Factors: Prior suicide attempts  Risk Reduction Factors:   Discharge to rehab facility  Continued Clinical Symptoms:  NA  Cognitive Features That Contribute To Risk:  None    Suicide Risk:  Minimal: No identifiable suicidal ideation.  Patients presenting with no risk factors but with morbid ruminations; may be classified as minimal risk based on the severity of the depressive symptoms   Follow-up Information     [facility] Follow up.   Specialty: Addiction Medicine Why: Referral made Contact information: [address] [place] [phone]         [facility] Follow up.   Why: Referral made Contact  information: [address] [place] [phone]  [facility] Follow up.   Why: Referral made Contact information: [address] [place] [address] [phone]                 Plan Of Care/Follow-up recommendations:  Follow up recommendations: - Activity as tolerated. - Diet as recommended by PCP. - Keep all scheduled follow-up appointments as recommended.    PGY-2 Freida Busman, MD 10/22/2021, 9:38 AM

## 2021-10-22 NOTE — Progress Notes (Signed)
°  [facility] Adult Case Management Discharge Plan :  Will you be returning to the same living situation after discharge:  No.  Going to a halfway house At discharge, do you have transportation home?: Yes,  arranged [employer] through CSW Do you have the ability to pay for your medications: No.  Release of information consent forms completed and in the chart;  Patient's signature needed at discharge.  Patient to Follow up at:  Follow-up Information     [facility] Follow up.   Specialty: Addiction Medicine Why: Referral made Contact information: [address] [place] [address] [phone]         [facility] Follow up.   Why: Referral made Contact information: [address] [place] [address] [phone]         [facility] Follow up.   Why: Referral made Contact information: [address] [place] [address] [phone]                 Next level of care provider has access to [facility] Link:no  Safety Planning and Suicide Prevention discussed: No.  Attempts made     Has patient been referred to the Quitline?: Patient refused referral  Patient has been referred for addiction treatment: Yes  Lynnell Chad, LCSW 10/22/2021, 10:08 AM

## 2021-10-22 NOTE — Group Note (Signed)
[facility] LCSW Group Therapy Note  Date:  10/22/2021   Type of Therapy and Topic:  Group Therapy:  Focus for the New Year  Participation Level:  Active   Description of Group:  The focus of this group was to provide patients with an opportunity to think about and discuss what they can work on this coming year that will result in them being happier and healthier one year from today.  It was reviewed how "new year's resolutions" often fade in importance within a few days, so patients were encouraged to think in a broader, more impactful way about what they wish to focus on to change their lives.  Therapeutic Goals Patients discussed in general the benefit of having goals to work on Patients described their own personal goals/focus for the next year that will enable them to be happier and healthier Patients received encouragement from each other and CSW Patients provided support and ideas to each other  Summary of Patient Progress: During group, patient expressed that his focus for the upcoming year is going to be sobriety for an entire year.  Additionally, pt wants to engage in AA once a day to achieve goal.   Therapeutic Modalities Processing   Ambrose Mantle, LCSW (led group)  Creola Corn, LCSWA (wrote notes)
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
## 2021-10-22 NOTE — Progress Notes (Signed)
D- Patient alert and oriented x4. Patient in stable mood.  Denies SI, HI, AVH. Pt interacting positively with staff and peers. Pt verbalized having back pain and leg cramping rated 5/10.    A- PRN medication given for pain. Scheduled medications administered to patient, per MD orders.Routine safety checks conducted every 15 minutes.  Patient informed to notify staff with problems or concerns.   R- Patient compliant with medications and verbalized understanding treatment plan. Patient receptive, calm, and cooperative.     10/21/21 2130  Psych Admission Type (Psych Patients Only)  Admission Status Voluntary  Psychosocial Assessment  Patient Complaints Other (Comment);Anxiety (Pain)  Eye Contact Fair  Facial Expression Flat  Affect Flat;Anxious  Speech Logical/coherent  Interaction Assertive  Motor Activity Other (Comment) (wdl)  Appearance/Hygiene Unremarkable  Behavior Characteristics Cooperative;Fidgety  Mood Anxious  Thought Process  Coherency WDL  Content WDL  Delusions None reported or observed  Perception WDL  Hallucination None reported or observed  Judgment Limited  Confusion None  Danger to Self  Current suicidal ideation? Denies  Self-Injurious Behavior Some self-injurious ideation observed or expressed.  No lethal plan expressed   Agreement Not to Harm Self Yes  Description of Agreement verbal contract for safety  Danger to Others  Danger to Others None reported or observed

## 2021-10-22 NOTE — Progress Notes (Signed)
Pt discharged to lobby to wait for Uber/Lyft to transport to [facility]. Pt was stable and appreciative at that time. All papers, samples,and prescriptions were given and valuables returned. Verbal understanding expressed. Denies SI/HI and A/VH. Pt given opportunity to express concerns and ask questions.

## 2021-10-22 NOTE — [facility] Group Notes (Signed)
[facility] Group Notes:  (Nursing/MHT/Case Management/Adjunct)  Date:  10/22/2021  Time:  10:57 AM  Type of Therapy:  Group Therapy  Participation Level:  Active  Participation Quality:  Appropriate  Affect:  Appropriate  Cognitive:  Appropriate  Insight:  Appropriate  Engagement in Group:  Engaged  Modes of Intervention:  Discussion  Summary of Progress/Problems:  Patient  engaged in group today.  Reymundo Poll 10/22/2021, 10:57 AM

## 2021-10-25 ENCOUNTER — Telehealth (HOSPITAL_COMMUNITY): Payer: Self-pay

## 2021-10-25 NOTE — BH Assessment (Signed)
Care Management - [facility] Follow Up Discharges   Patient has been placed in an inpatient psychiatric hospital [facility]) on 10-18-21.

## 2022-04-29 IMAGING — CR DG CHEST 2V
2 series · 2 of 2 positions shown · non-contrast
Comparison: February 05, 2021

CLINICAL DATA: chest pain

EXAM:
CHEST - 2 VIEW

[chest pa]
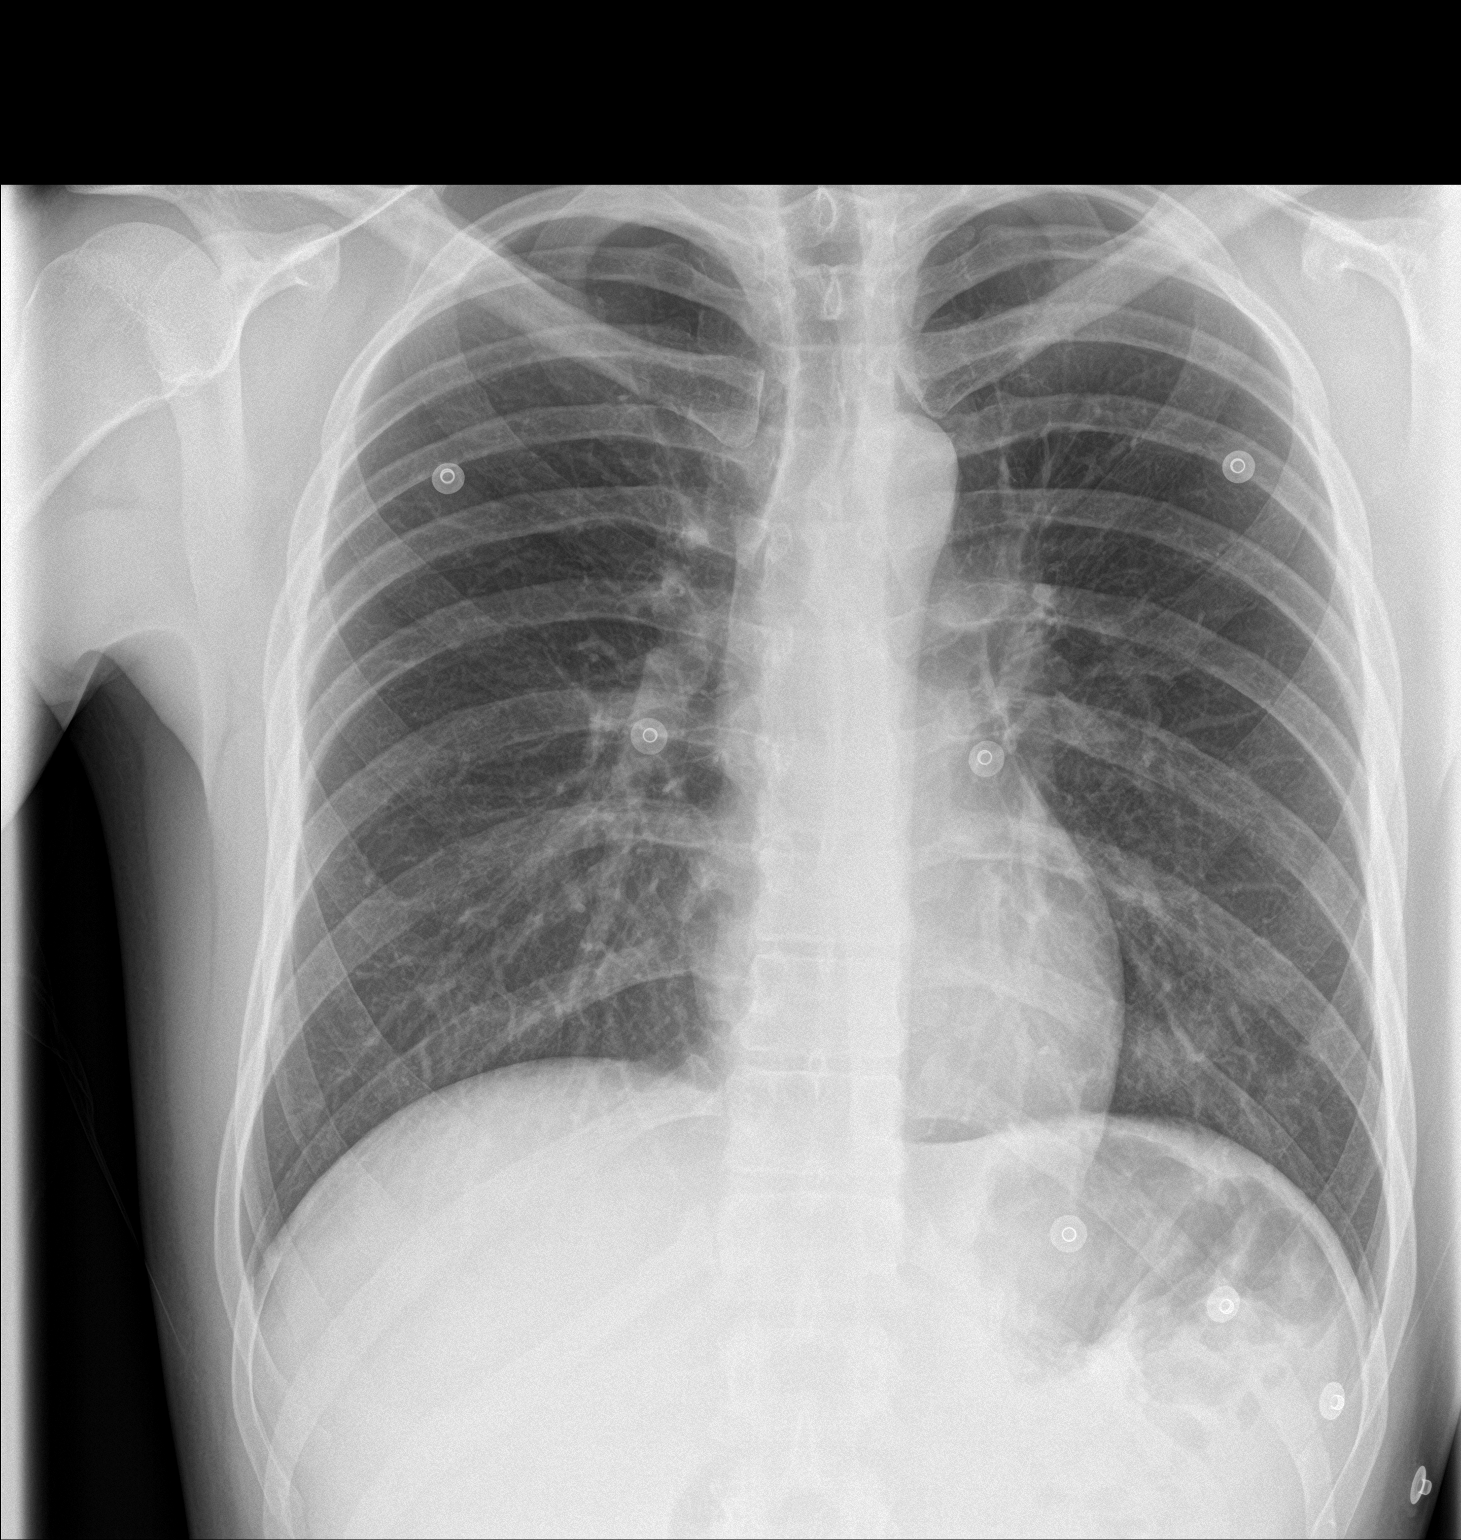

[chest lat]
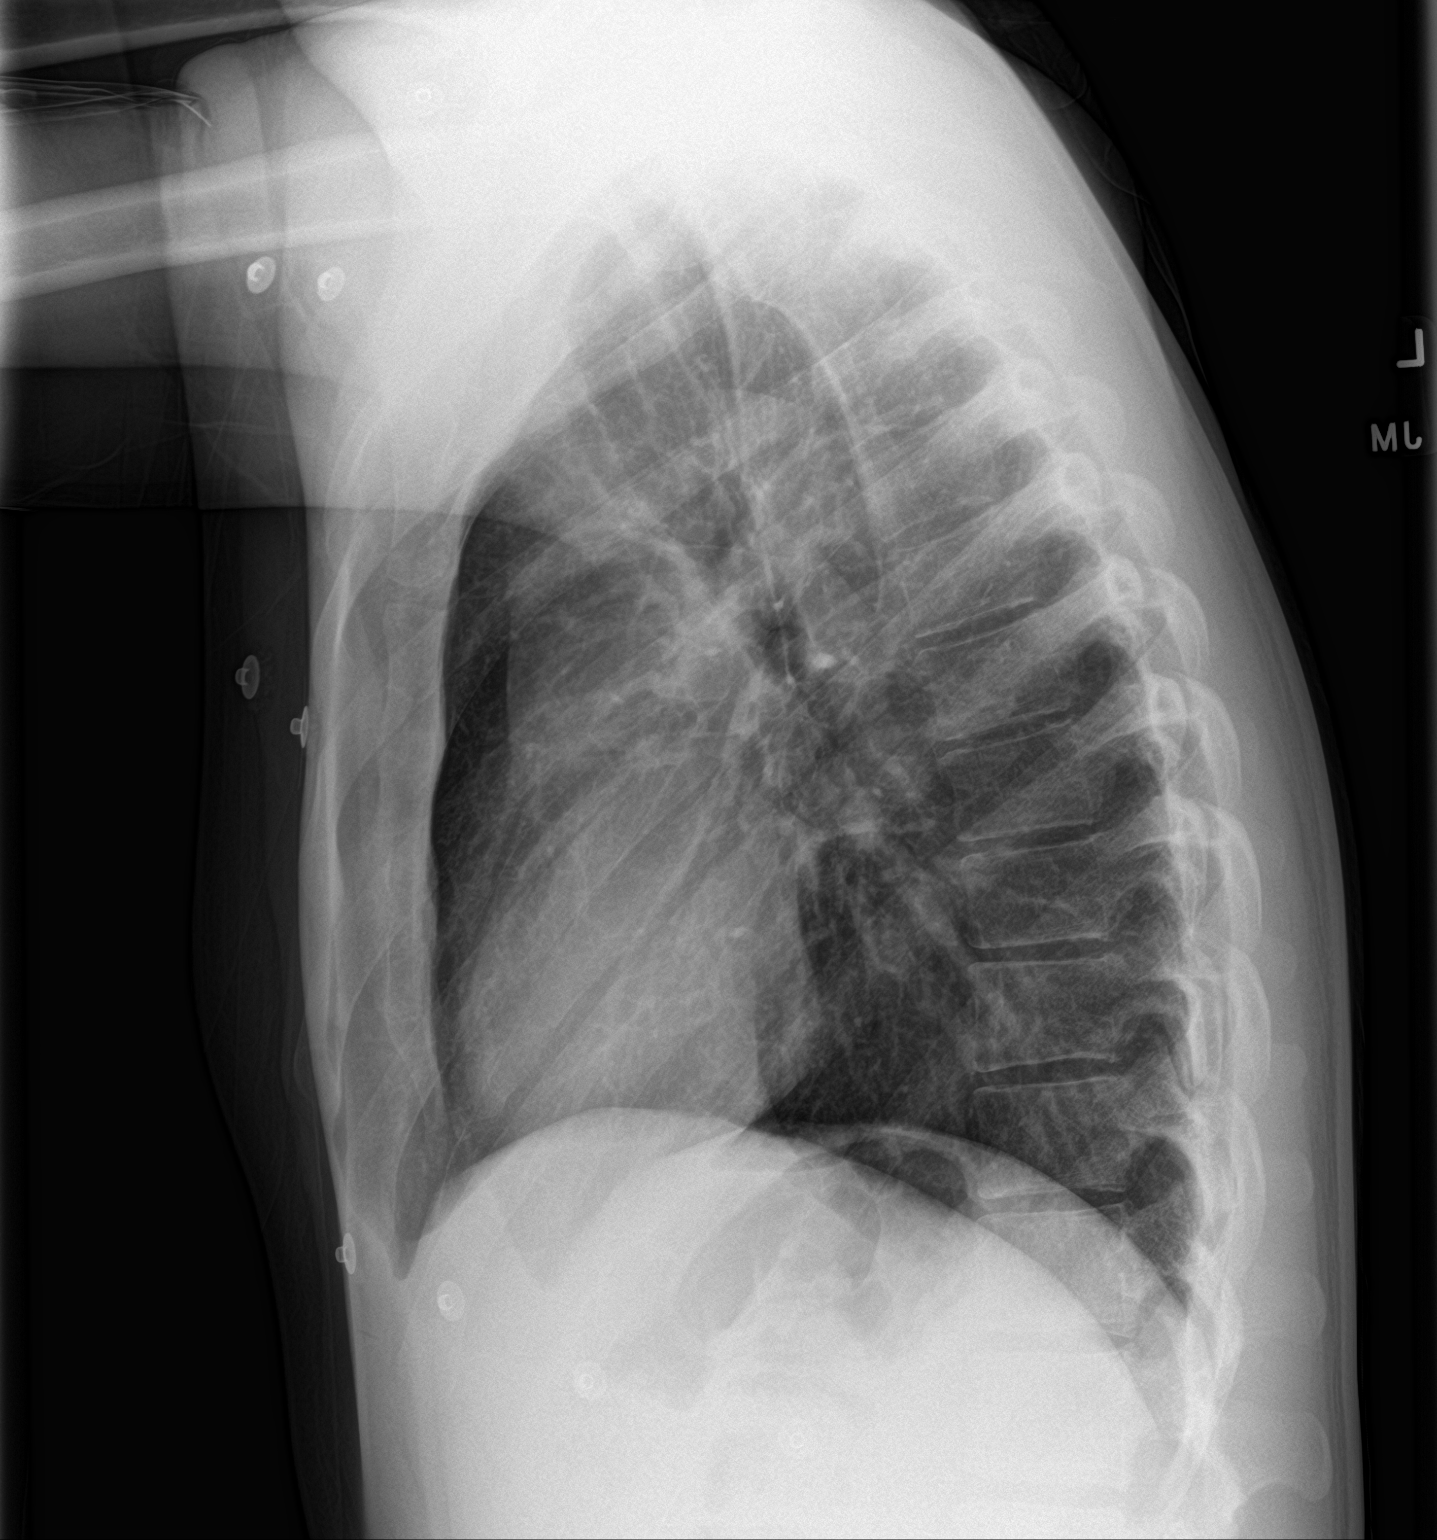

[2 of 2 positions shown; findings below may reference images not displayed]

FINDINGS: The cardiomediastinal silhouette is normal in contour. No pleural
effusion. No pneumothorax. No acute pleuroparenchymal abnormality.
Visualized abdomen is unremarkable.
IMPRESSION: No acute cardiopulmonary abnormality.
